# Patient Record
Sex: Female | Born: 1985 | Race: White | Hispanic: No | Marital: Married | Smoking: Former smoker
Health system: Southern US, Community
[De-identification: ages and names within clinical notes are randomized; demographics above are authoritative.]

## PROBLEM LIST (undated history)

## (undated) DIAGNOSIS — F32A Depression, unspecified: Secondary | ICD-10-CM

## (undated) DIAGNOSIS — L989 Disorder of the skin and subcutaneous tissue, unspecified: Secondary | ICD-10-CM

## (undated) DIAGNOSIS — F329 Major depressive disorder, single episode, unspecified: Secondary | ICD-10-CM

## (undated) DIAGNOSIS — N189 Chronic kidney disease, unspecified: Secondary | ICD-10-CM

## (undated) DIAGNOSIS — F909 Attention-deficit hyperactivity disorder, unspecified type: Secondary | ICD-10-CM

## (undated) DIAGNOSIS — F988 Other specified behavioral and emotional disorders with onset usually occurring in childhood and adolescence: Secondary | ICD-10-CM

## (undated) DIAGNOSIS — R519 Headache, unspecified: Secondary | ICD-10-CM

## (undated) DIAGNOSIS — R51 Headache: Secondary | ICD-10-CM

## (undated) DIAGNOSIS — T7840XA Allergy, unspecified, initial encounter: Secondary | ICD-10-CM

## (undated) HISTORY — PX: WISDOM TOOTH EXTRACTION: SHX21

## (undated) HISTORY — DX: Allergy, unspecified, initial encounter: T78.40XA

## (undated) HISTORY — DX: Headache, unspecified: R51.9

## (undated) HISTORY — PX: OTHER SURGICAL HISTORY: SHX169

## (undated) HISTORY — DX: Chronic kidney disease, unspecified: N18.9

## (undated) HISTORY — DX: Major depressive disorder, single episode, unspecified: F32.9

## (undated) HISTORY — PX: NO PAST SURGERIES: SHX2092

## (undated) HISTORY — DX: Disorder of the skin and subcutaneous tissue, unspecified: L98.9

## (undated) HISTORY — DX: Depression, unspecified: F32.A

## (undated) HISTORY — DX: Headache: R51

---
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
## 2009-06-23 DIAGNOSIS — D239 Other benign neoplasm of skin, unspecified: Secondary | ICD-10-CM

## 2009-06-23 HISTORY — DX: Other benign neoplasm of skin, unspecified: D23.9

## 2011-05-31 DIAGNOSIS — F909 Attention-deficit hyperactivity disorder, unspecified type: Secondary | ICD-10-CM

## 2011-05-31 HISTORY — DX: Attention-deficit hyperactivity disorder, unspecified type: F90.9

## 2012-01-10 LAB — PULMONARY FUNCTION TEST

## 2012-11-04 LAB — HM PAP SMEAR: HM Pap smear: NORMAL

## 2013-01-11 ENCOUNTER — Ambulatory Visit: Payer: Self-pay | Admitting: Internal Medicine

## 2013-02-04 ENCOUNTER — Encounter: Payer: Self-pay | Admitting: Internal Medicine

## 2013-02-04 ENCOUNTER — Ambulatory Visit (INDEPENDENT_AMBULATORY_CARE_PROVIDER_SITE_OTHER): Payer: Managed Care, Other (non HMO) | Admitting: Internal Medicine

## 2013-02-04 VITALS — BP 112/60 | HR 90 | Temp 98.0°F | Resp 14 | Ht 65.75 in | Wt 113.2 lb

## 2013-02-04 DIAGNOSIS — K5229 Other allergic and dietetic gastroenteritis and colitis: Secondary | ICD-10-CM | POA: Insufficient documentation

## 2013-02-04 DIAGNOSIS — M25579 Pain in unspecified ankle and joints of unspecified foot: Secondary | ICD-10-CM | POA: Insufficient documentation

## 2013-02-04 DIAGNOSIS — K9041 Non-celiac gluten sensitivity: Secondary | ICD-10-CM

## 2013-02-04 DIAGNOSIS — K900 Celiac disease: Secondary | ICD-10-CM

## 2013-02-04 DIAGNOSIS — M25571 Pain in right ankle and joints of right foot: Secondary | ICD-10-CM

## 2013-02-04 DIAGNOSIS — M8580 Other specified disorders of bone density and structure, unspecified site: Secondary | ICD-10-CM | POA: Insufficient documentation

## 2013-02-04 DIAGNOSIS — F988 Other specified behavioral and emotional disorders with onset usually occurring in childhood and adolescence: Secondary | ICD-10-CM | POA: Insufficient documentation

## 2013-02-04 DIAGNOSIS — F411 Generalized anxiety disorder: Secondary | ICD-10-CM

## 2013-02-04 DIAGNOSIS — M899 Disorder of bone, unspecified: Secondary | ICD-10-CM

## 2013-02-04 NOTE — Assessment & Plan Note (Signed)
accompanied by skin rash.  celiac panel pending. Gluten free diet discussed but not tried.
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
## 2013-02-04 NOTE — Assessment & Plan Note (Signed)
Calcium and vitamin and wt bearing exercise,  Repeat DE XA in 2015.

## 2013-02-04 NOTE — Assessment & Plan Note (Signed)
Multiple issues including bunion , 2nd toe contracture, referring to podiatry
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
## 2013-02-04 NOTE — Patient Instructions (Addendum)
Make sure you are taking  a MVI with 1 mg folic acid daily  Generic allegra is fexofenadine 60 or 180 mg  Look at [facility] generic zyrtec  Is cetirizine Generic claritin is loratadine  You should be taking 1000 units of Vit D and 1200 mg calcium  Through diet

## 2013-02-06 ENCOUNTER — Encounter: Payer: Self-pay | Admitting: Internal Medicine

## 2013-02-07 ENCOUNTER — Telehealth: Payer: Self-pay | Admitting: Internal Medicine

## 2013-02-07 NOTE — Telephone Encounter (Signed)
Letter mailed, but called patient and notified.

## 2013-02-07 NOTE — Telephone Encounter (Signed)
Calling for results  Work:  [phone] Or cell

## 2013-02-15 ENCOUNTER — Encounter: Payer: Self-pay | Admitting: Emergency Medicine

## 2013-02-15 ENCOUNTER — Ambulatory Visit (INDEPENDENT_AMBULATORY_CARE_PROVIDER_SITE_OTHER): Payer: Managed Care, Other (non HMO) | Admitting: Adult Health

## 2013-02-15 ENCOUNTER — Encounter: Payer: Self-pay | Admitting: Adult Health

## 2013-02-15 VITALS — BP 100/58 | HR 100 | Temp 98.6°F | Resp 12 | Ht 65.75 in | Wt 112.5 lb

## 2013-02-15 DIAGNOSIS — L255 Unspecified contact dermatitis due to plants, except food: Secondary | ICD-10-CM

## 2013-02-15 DIAGNOSIS — L237 Allergic contact dermatitis due to plants, except food: Secondary | ICD-10-CM | POA: Insufficient documentation

## 2013-02-15 MED ORDER — TRIAMCINOLONE ACETONIDE 0.1 % EX CREA: TOPICAL_CREAM | Freq: Two times a day (BID) | CUTANEOUS | Status: DC

## 2013-02-15 MED ORDER — PREDNISONE 10 MG PO TABS: ORAL_TABLET | ORAL | Status: DC

## 2013-02-15 NOTE — Patient Instructions (Addendum)
  Start your prednisone taper today. You will start with 6 tablets and then decreased by 1 tablet daily until done.  Use triamcinolone cream to the affected areas twice daily. Do not apply around the eyes.  Use benadryl for itching.  You can also try over the counter lotions such as Aveno products, calamine lotion, or benadryl anti-itch cream.  Please call if there is no improvement within 4-5 days.

## 2013-02-15 NOTE — Progress Notes (Signed)
  Subjective:    Patient ID: Tonya Peterson, female    DOB: [birth year], 27 y.o.   MRN: [id number]  HPI  Patient is a pleasant 27 year old female who presents to clinic after exposure to poison ivy while gardening. She is complaining of itching after developing a rash Tuesday night. The rashes on bilateral arms, neck and right shoulder. She has been applying an over-the-counter cream specifically for poison ivy but the itching has not been relieved.   Current Outpatient Prescriptions on File Prior to Visit  Medication Sig Dispense Refill  . amphetamine-dextroamphetamine (ADDERALL) 20 MG tablet Take 20 mg by mouth 2 (two) times daily.      . busPIRone (BUSPAR) 10 MG tablet Take 10 mg by mouth 3 (three) times daily as needed.      . norethindrone-ethinyl estradiol (MICROGESTIN,JUNEL,LOESTRIN) 1-20 MG-MCG tablet Take 1 tablet by mouth daily.       No current facility-administered medications on file prior to visit.     Review of Systems  Constitutional: Negative.   Respiratory: Negative.   Cardiovascular: Negative.   Skin: Positive for rash.       itching  Neurological: Negative.   Psychiatric/Behavioral: Negative.     BP 100/58  Pulse 100  Temp(Src) 98.6 F (37 C) (Oral)  Resp 12  Ht 5' 5.75" (1.67 m)  Wt 112 lb 8 oz (51.03 kg)  BMI 18.3 kg/m2  SpO2 98%     Objective:   Physical Exam  Cardiovascular: Normal rate and regular rhythm.   Pulmonary/Chest: Effort normal. No respiratory distress.  Skin:  Rash on bilateral arm, right shoulder, below her chin and on her neck. No s/s infection noted.  Psychiatric: She has a normal mood and affect. Her behavior is normal. Judgment and thought content normal.          Assessment & Plan:

## 2013-02-15 NOTE — Assessment & Plan Note (Signed)
Start prednisone taper beginning with 60 mg and decreasing by 10 mg daily until complete. Benadryl for itching. Triamcinolone cream to affected area twice a day as needed. Try Aveeno products which are very gentle and nonirritating.

## 2013-03-06 ENCOUNTER — Ambulatory Visit (INDEPENDENT_AMBULATORY_CARE_PROVIDER_SITE_OTHER): Payer: Managed Care, Other (non HMO) | Admitting: Internal Medicine

## 2013-03-06 ENCOUNTER — Encounter: Payer: Self-pay | Admitting: Internal Medicine

## 2013-03-06 VITALS — BP 118/78 | HR 88 | Temp 98.9°F | Resp 14 | Ht 65.75 in | Wt 113.2 lb

## 2013-03-06 DIAGNOSIS — R109 Unspecified abdominal pain: Secondary | ICD-10-CM

## 2013-03-06 DIAGNOSIS — M25579 Pain in unspecified ankle and joints of unspecified foot: Secondary | ICD-10-CM

## 2013-03-06 DIAGNOSIS — Z23 Encounter for immunization: Secondary | ICD-10-CM

## 2013-03-06 DIAGNOSIS — F988 Other specified behavioral and emotional disorders with onset usually occurring in childhood and adolescence: Secondary | ICD-10-CM

## 2013-03-06 DIAGNOSIS — K5229 Other allergic and dietetic gastroenteritis and colitis: Secondary | ICD-10-CM

## 2013-03-06 NOTE — Progress Notes (Signed)
Patient ID: Tonya Peterson, female   DOB: May 08, 1986, 27 y.o.   MRN: [id number]   Patient Active Problem List   Diagnosis Date Noted  . ADD (attention deficit disorder) 02/04/2013  . GAD (generalized anxiety disorder) 02/04/2013  . Osteopenia 02/04/2013  . 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 Pain in joint, ankle and foot 02/04/2013    Subjective:  CC:   Chief Complaint  Patient presents with  . Follow-up    1 month follow up    HPI:   Tonya Peterson a 27 y.o. female who presents One month follow up on multiple issues raised at initial visit one month ago.    1) Foot pain,  She was referred to podiatry for evaluation of foot pain secondary to bunion and contractures but did not keep the appt.  She s now interested in scheduling.  2) suspected food and environmental allergies.  She continues to develop recurrent redness of skin and abdominal pain within an hour or two of ingesting certain foods, e.g., avocadoes,  [employer]  bacon egg and cheese biscuits, and steak all cause severe abdominal pain. No prior GI evaluation .  Prior environmental and food allergy testing was incomplete.     Past Medical History  Diagnosis Date  . Depression   . Allergy   . Chronic kidney disease     stones  . Frequent headaches   . Skin abnormalities     precancerous moles    Past Surgical History  Procedure Laterality Date  . Ingrown toe nail Right     great toe       The following portions of the patient's history were reviewed and updated as appropriate: Allergies, current medications, and problem list.    Review of Systems:   12 Pt  review of systems was negative except those addressed in the HPI,     History   Social History  . Marital Status: Unknown    Spouse Name: N/A    Number of Children: N/A  . Years of Education: N/A   Occupational History  . Not on file.   Social History Main Topics  . Smoking status: Current Every Day Smoker -- 0.50  packs/day for 5 years    Types: Cigarettes    Start date: 02/05/2008  . Smokeless tobacco: Never Used  . Alcohol Use: Yes  . Drug Use: No  . Sexual Activity: Yes   Other Topics Concern  . Not on file   Social History Narrative  . No narrative on file    Objective:  Filed Vitals:   03/06/13 1615  BP: 118/78  Pulse: 88  Temp: 98.9 F (37.2 C)  Resp: 14     General appearance: alert, cooperative and appears stated age Ears: normal TM's and external ear canals both ears Throat: lips, mucosa, and tongue normal; teeth and gums normal Neck: no adenopathy, no carotid bruit, supple, symmetrical, trachea midline and thyroid not enlarged, symmetric, no tenderness/mass/nodules Back: symmetric, no curvature. ROM normal. No CVA tenderness. Lungs: clear to auscultation bilaterally Heart: regular rate and rhythm, S1, S2 normal, no murmur, click, rub or gallop Abdomen: soft, non-tender; bowel sounds normal; no masses,  no organomegaly Pulses: 2+ and symmetric Skin: Skin color, texture, turgor normal. No rashes or lesions Lymph nodes: Cervical, supraclavicular, and axillary nodes normal.  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 Consider  IBS bs gallbladder vs food allergy.   U/s and refer to allergy specialist for testing .  Celiac panel was negative    Updated Medication List Outpatient Encounter Prescriptions as of 03/06/2013  Medication Sig Dispense Refill  . amphetamine-dextroamphetamine (ADDERALL) 20 MG tablet Take 20 mg by mouth 2 (two) times daily.      . busPIRone (BUSPAR) 10 MG tablet Take 10 mg by mouth 3 (three) times daily as needed.      . norethindrone-ethinyl estradiol (MICROGESTIN,JUNEL,LOESTRIN) 1-20  MG-MCG tablet Take 1 tablet by mouth daily.      Marland Kitchen triamcinolone cream (KENALOG) 0.1 % Apply topically 2 (two) times daily.  30 g  0  . [DISCONTINUED] predniSONE (DELTASONE) 10 MG tablet Start with 60 mg (6 tablets) and reduce by 10 mg (1 tablet) daily until done.  21 tablet  0   No facility-administered encounter medications on file as of 03/06/2013.     Orders Placed This Encounter  Procedures  . US Abdomen Complete  . Flu Vaccine QUAD 36+ mos PF IM (Fluarix)  . Ambulatory referral to Allergy    No Follow-up on file.

## 2013-03-08 ENCOUNTER — Ambulatory Visit (HOSPITAL_COMMUNITY)
Admission: RE | Admit: 2013-03-08 | Discharge: 2013-03-08 | Disposition: A | Discharge status: Home / Self Care | Payer: Managed Care, Other (non HMO) | Source: Ambulatory Visit | Attending: Internal Medicine | Admitting: Internal Medicine

## 2013-03-08 DIAGNOSIS — R109 Unspecified abdominal pain: Secondary | ICD-10-CM | POA: Insufficient documentation

## 2013-03-08 DIAGNOSIS — N281 Cyst of kidney, acquired: Secondary | ICD-10-CM | POA: Insufficient documentation

## 2013-03-09 ENCOUNTER — Encounter: Payer: Self-pay | Admitting: Internal Medicine

## 2013-03-09 NOTE — Assessment & Plan Note (Addendum)
Accompanied by by cramping abdominal pain and skin erythema. Consider  IBS bs gallbladder vs food allergy.   U/s and refer to allergy specialist for testing .  Celiac panel was negative

## 2013-03-09 NOTE — Assessment & Plan Note (Signed)
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

## 2013-03-09 NOTE — Assessment & Plan Note (Signed)
Refer to podiatry for bunion and contracture

## 2013-04-01 ENCOUNTER — Encounter: Payer: Self-pay | Admitting: Internal Medicine

## 2013-04-24 ENCOUNTER — Encounter: Payer: Self-pay | Admitting: Internal Medicine

## 2013-04-24 ENCOUNTER — Ambulatory Visit (INDEPENDENT_AMBULATORY_CARE_PROVIDER_SITE_OTHER): Payer: Managed Care, Other (non HMO) | Admitting: Internal Medicine

## 2013-04-24 ENCOUNTER — Ambulatory Visit: Payer: Managed Care, Other (non HMO)

## 2013-04-24 DIAGNOSIS — J309 Allergic rhinitis, unspecified: Secondary | ICD-10-CM

## 2013-04-24 DIAGNOSIS — J302 Other seasonal allergic rhinitis: Secondary | ICD-10-CM

## 2013-04-24 DIAGNOSIS — T788XXS Other adverse effects, not elsewhere classified, sequela: Secondary | ICD-10-CM

## 2013-04-24 DIAGNOSIS — K5229 Other allergic and dietetic gastroenteritis and colitis: Secondary | ICD-10-CM

## 2013-04-24 NOTE — Progress Notes (Signed)
04/24/13- 27 yoF smoker referred by Dr. Darrick Huntsman for allergy evaluation. Complains of skin rash and short of breath , light-headed and congested this summer, associated with yard work. Stomach pains and diarrhea associated with avocado, orange juice, fatty foods- after eating at [employer] and [employer]. Says head hurt, stomach pains, lethargic. Childhood rash blamed on ranch dressing. Seasonal rhinitis with spring and fall pollens.  No problem with latex, aspirin. Celiac work-up and US abdomen negative.Skin testing for foods and environmental allergens at [facility] 04/10/12- pos for multiple grass, weed and tree pollens, banana, cantaloupe,egg, fish, cow milk, soy bean wheat, apple, celery. No ENT surgery Environment- rental house since March, 2014, oil heat, cat dog, no basement, no mold. Smokes 1/2 ppd. Married- denies pregnant. Works as Diplomatic Services operational officer at [employer].. Father and brother allergic rhinitis  Prior to Admission medications   Medication Sig Start Date End Date Taking? Authorizing Provider  amphetamine-dextroamphetamine (ADDERALL) 20 MG tablet Take 20 mg by mouth 2 (two) times daily.    Historical Provider, MD  busPIRone (BUSPAR) 10 MG tablet Take 10 mg by mouth 3 (three) times daily as needed.    Historical Provider, MD  norethindrone-ethinyl estradiol (MICROGESTIN,JUNEL,LOESTRIN) 1-20 MG-MCG tablet Take 1 tablet by mouth daily.    Historical Provider, MD  triamcinolone cream (KENALOG) 0.1 % Apply topically 2 (two) times daily. 02/15/13   Orville Govern, NP   Past Medical History  Diagnosis Date  . Depression   . Allergy   . Chronic kidney disease     stones  . Frequent headaches   . Skin abnormalities     precancerous moles   Past Surgical History  Procedure Laterality Date  . Ingrown toe nail Right     great toe  . Gum graft     Family History  Problem Relation Age of Onset  . Diabetes Mother   . Hypertension Father   . Cancer Maternal Grandfather   . Diabetes Paternal Grandmother    . Cancer Paternal Grandfather    History   Social History  . Marital Status: Married    Spouse Name: N/A    Number of Children: 0  . Years of Education: N/A   Occupational History  . 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 Smoking status: Current Every Day Smoker -- 0.50 packs/day for 5 years    Types: Cigarettes    Start date: 02/05/2008  . Smokeless tobacco: Never Used  . Alcohol Use: Yes     Comment: rare/social  . Drug Use: No  . Sexual Activity: Yes   Other Topics Concern  . Not on file   Social History Narrative  . No narrative on file   ROS-see HPI Constitutional:   No-   weight loss, night sweats, fevers, chills, fatigue, lassitude. HEENT:   No-  headaches, difficulty swallowing, tooth/dental problems, sore throat,       No-  sneezing, itching, ear ache, +nasal congestion, +post nasal drip,  CV:  No-   chest pain, orthopnea, PND, swelling in lower extremities, anasarca,  dizziness, palpitations Resp: No-   shortness of breath with exertion or at rest.              No-   productive cough,  No non-productive cough,  No- coughing up of blood.              No-   change in color of mucus.  No- wheezing.   Skin: + rash or lesions. GI:  No-   heartburn, indigestion, +abdominal pain, nausea, vomiting, diarrhea,                 change in bowel habits, loss of appetite GU: No-   dysuria, change in color of urine, no urgency or frequency.  No- flank pain. MS:  No-   joint pain or swelling.  No- decreased range of motion.  No- back pain. Neuro-     nothing unusual Psych:  No- change in mood or affect. No depression or anxiety.  No memory loss.  OBJ- Physical Exam General- Alert, Oriented, Affect-appropriate, Distress- none acute. Slender Skin- rash-none, lesions- none, excoriation- none Lymphadenopathy- none Head- atraumatic            Eyes- Gross vision intact, PERRLA, conjunctivae and secretions clear            Ears- Hearing, canals-normal             Nose- Clear, no-Septal dev, mucus, polyps, erosion, perforation             Throat- Mallampati II , mucosa clear , drainage- none, tonsils-present Neck- flexible , trachea midline, no stridor , thyroid nl, carotid no bruit Chest - symmetrical excursion , unlabored           Heart/CV- RRR , no murmur , no gallop  , no rub, nl s1 s2                           - JVD- none , edema- none, stasis changes- none, varices- none           Lung- clear to P&A, wheeze- none, cough- none , dullness-none, rub- none           Chest wall-  Abd- tender-no, distended-no, bowel sounds-present, HSM- no Br/ Gen/ Rectal- Not done, not indicated Extrem- cyanosis- none, clubbing, none, atrophy- none, strength- nl Neuro- grossly intact to observation

## 2013-04-24 NOTE — Patient Instructions (Addendum)
Order- lab- Allergy profile, Food IgE profile, Food IgG profile, IgE to avocado, IgE to alpha-gal/ alpha 1-3 galactose    Dx food allergy , allergic rhinitis

## 2013-04-25 LAB — ALLERGY FULL PROFILE
Allergen, D pternoyssinus,d7: 0.48 kU/L — ABNORMAL HIGH
Bahia Grass: 0.27 kU/L — ABNORMAL HIGH
Bermuda Grass: <0.1 kU/L
Candida Albicans: <0.1 kU/L
Curvularia lunata: <0.1 kU/L
D. farinae: 0.45 kU/L — ABNORMAL HIGH
Dog Dander: <0.1 kU/L
Elm IgE: <0.1 kU/L
Fescue: 0.43 kU/L — ABNORMAL HIGH
G009 Red Top: 0.47 kU/L — ABNORMAL HIGH
Goldenrod: <0.1 kU/L
Oak: <0.1 kU/L
Sycamore Tree: 0.11 kU/L — ABNORMAL HIGH
Timothy Grass: 0.34 kU/L — ABNORMAL HIGH

## 2013-04-25 LAB — ALLERGEN FOOD PROFILE SPECIFIC IGE
Corn: <0.1 kU/L
Fish Cod: <0.1 kU/L
IgE (Immunoglobulin E), Serum: 43.5 IU/mL (ref 0.0–180.0)
Milk IgE: <0.1 kU/L
Peanut IgE: <0.1 kU/L
Soybean IgE: <0.1 kU/L
Tuna IgE: <0.1 kU/L
Wheat IgE: <0.1 kU/L

## 2013-04-25 LAB — ALLERGEN AVOCADO F96: Allergen Avocado f96: <0.1 kU/L

## 2013-05-01 LAB — IGG FOOD PANEL
Allergen, Milk, IgG: 16.4 ug/mL — ABNORMAL HIGH (ref <0.15)
Beef, IgG: 6.7 ug/mL — ABNORMAL HIGH (ref <2.0)
Corn, IgG: <0.15 ug/mL (ref <0.15)
Egg yolk, IgG: 2.4 ug/mL — ABNORMAL HIGH (ref <2.0)

## 2013-05-08 NOTE — Progress Notes (Signed)
Quick Note:  Released to MyChart account and left message for patient to call the office if any concerns. ______

## 2013-05-14 ENCOUNTER — Encounter: Payer: Self-pay | Admitting: Internal Medicine

## 2013-05-14 DIAGNOSIS — J302 Other seasonal allergic rhinitis: Secondary | ICD-10-CM | POA: Insufficient documentation

## 2013-05-14 NOTE — Assessment & Plan Note (Signed)
This may be incidental to her other multiple somatic complaints. Plan- Allergy profiles with attention to IgE. Antihistamines and nasal sprays if helpful.

## 2013-05-14 NOTE — Assessment & Plan Note (Addendum)
Very indistinct relation between symptoms and specific foods Plan- Allergy profiles, food diary

## 2013-06-05 ENCOUNTER — Ambulatory Visit: Payer: Managed Care, Other (non HMO) | Admitting: Internal Medicine

## 2013-09-24 LAB — OB RESULTS CONSOLE GBS
GBS: NEGATIVE
STREP GROUP B AG: NEGATIVE

## 2013-09-24 LAB — OB RESULTS CONSOLE GC/CHLAMYDIA
Chlamydia: NEGATIVE
Gonorrhea: NEGATIVE
Gonorrhea: NEGATIVE

## 2014-03-10 LAB — OB RESULTS CONSOLE RPR: RPR: NONREACTIVE

## 2014-03-10 LAB — OB RESULTS CONSOLE ABO/RH: RH Type: POSITIVE

## 2014-03-10 LAB — OB RESULTS CONSOLE HIV ANTIBODY (ROUTINE TESTING): HIV: NONREACTIVE

## 2014-03-10 LAB — OB RESULTS CONSOLE RUBELLA ANTIBODY, IGM: Rubella: IMMUNE

## 2014-03-10 LAB — OB RESULTS CONSOLE ANTIBODY SCREEN: Antibody Screen: NEGATIVE

## 2014-03-10 LAB — OB RESULTS CONSOLE VARICELLA ZOSTER ANTIBODY, IGG: Varicella: NON-IMMUNE/NOT IMMUNE

## 2014-03-10 LAB — OB RESULTS CONSOLE HEPATITIS B SURFACE ANTIGEN: HEP B S AG: NEGATIVE

## 2014-05-30 NOTE — L&D Delivery Note (Signed)
Delivery Note At 6:30 PM a viable female was delivered via Vaginal, Spontaneous Delivery (Presentation: ;  ).  APGAR: 9, 10;   Placenta status: Intact, Spontaneous.  Cord: 3 vessels with the following complications: None.  Weight:        6# 14.5 Oz.        Grams:  [id number] Infant's name: Anette Riedeloah   Anesthesia: Epidural  Episiotomy: None Lacerations: 2nd degree Suture Repair: 2.0   Est. Blood Loss (mL): 375  Mom to postpartum.  Baby to Couplet care / Skin to Skin.  SVD of a live viable female infant in OA position after a long second stage. After SROM, light meconium was seen- peds was available during delivery. After delivery of the infant, he was noted to be vigorous and was placed on the maternal abdomen where the cord was clamped and cut after pulsation ceased by the fob. Apgars were 9/10. The placenta was then delivered via Duncan mechanism and was intact with a 3-vessel cord. Pitocin was started and the uterus was firm with fundal massage and had minimal bleeding. The perineum was inspected and was found to have a second degree tear and was repaired with a 2-0 vicryl under epidural anesthesia and was hemostatic after repair. EBL was 375. Mother and baby are bonding well. Weight is pending after mother finishes breastfeeding.   Jannet MantisSubudhi,  Shilah Hefel 10/06/2014, 7:33 PM

## 2014-09-25 LAB — OB RESULTS CONSOLE GC/CHLAMYDIA
Chlamydia: NEGATIVE
Gonorrhea: NEGATIVE

## 2014-09-25 LAB — OB RESULTS CONSOLE GBS: GBS: NEGATIVE

## 2014-10-06 ENCOUNTER — Inpatient Hospital Stay
Admission: EM | Admit: 2014-10-06 | Discharge: 2014-10-08 | DRG: 775 | Disposition: A | Discharge status: Home / Self Care | Payer: Medicaid Other | Attending: Obstetrics and Gynecology | Admitting: Obstetrics and Gynecology

## 2014-10-06 ENCOUNTER — Encounter: Payer: Self-pay | Admitting: *Deleted

## 2014-10-06 ENCOUNTER — Inpatient Hospital Stay: Payer: Medicaid Other | Admitting: Registered Nurse

## 2014-10-06 DIAGNOSIS — O9902 Anemia complicating childbirth: Secondary | ICD-10-CM | POA: Diagnosis not present

## 2014-10-06 DIAGNOSIS — O480 Post-term pregnancy: Secondary | ICD-10-CM | POA: Diagnosis present

## 2014-10-06 DIAGNOSIS — O99344 Other mental disorders complicating childbirth: Secondary | ICD-10-CM | POA: Diagnosis present

## 2014-10-06 DIAGNOSIS — Z3A40 40 weeks gestation of pregnancy: Secondary | ICD-10-CM | POA: Diagnosis present

## 2014-10-06 DIAGNOSIS — D500 Iron deficiency anemia secondary to blood loss (chronic): Secondary | ICD-10-CM | POA: Diagnosis present

## 2014-10-06 DIAGNOSIS — Z87891 Personal history of nicotine dependence: Secondary | ICD-10-CM | POA: Diagnosis not present

## 2014-10-06 DIAGNOSIS — F411 Generalized anxiety disorder: Secondary | ICD-10-CM | POA: Diagnosis present

## 2014-10-06 HISTORY — DX: Attention-deficit hyperactivity disorder, unspecified type: F90.9

## 2014-10-06 HISTORY — DX: Other specified behavioral and emotional disorders with onset usually occurring in childhood and adolescence: F98.8

## 2014-10-06 LAB — CBC
HCT: 37 % (ref 35.0–47.0)
Hemoglobin: 12.2 g/dL (ref 12.0–16.0)
MCH: 31.2 pg (ref 26.0–34.0)
MCHC: 33 g/dL (ref 32.0–36.0)
MCV: 94.8 fL (ref 80.0–100.0)
Platelets: 128 10*3/uL — ABNORMAL LOW (ref 150–440)
RBC: 3.9 MIL/uL (ref 3.80–5.20)
RDW: 12.7 % (ref 11.5–14.5)
WBC: 19.2 10*3/uL — AB (ref 3.6–11.0)

## 2014-10-06 LAB — TYPE AND SCREEN
ABO/RH(D): O POS
Antibody Screen: NEGATIVE

## 2014-10-06 LAB — ABO/RH: ABO/RH(D): O POS

## 2014-10-06 MED ORDER — BUPIVACAINE HCL (PF) 0.25 % IJ SOLN
INTRAMUSCULAR | Status: DC | PRN
  Administered 2014-10-06 (×2): 4 mL

## 2014-10-06 MED ORDER — PHENYLEPHRINE 40 MCG/ML (10ML) SYRINGE FOR IV PUSH (FOR BLOOD PRESSURE SUPPORT)
80.0000 ug | PREFILLED_SYRINGE | INTRAVENOUS | Status: DC | PRN
  Filled 2014-10-06: qty 2

## 2014-10-06 MED ORDER — OXYTOCIN BOLUS FROM INFUSION: 500.0000 mL | INTRAVENOUS | Status: DC

## 2014-10-06 MED ORDER — IBUPROFEN 600 MG PO TABS
ORAL_TABLET | ORAL | Status: AC
  Administered 2014-10-07: 600 mg via ORAL
  Filled 2014-10-06: qty 1

## 2014-10-06 MED ORDER — DIPHENHYDRAMINE HCL 25 MG PO CAPS: 25.0000 mg | ORAL_CAPSULE | Freq: Four times a day (QID) | ORAL | Status: DC | PRN

## 2014-10-06 MED ORDER — ACETAMINOPHEN 325 MG PO TABS: 650.0000 mg | ORAL_TABLET | ORAL | Status: DC | PRN

## 2014-10-06 MED ORDER — EPHEDRINE 5 MG/ML INJ
10.0000 mg | INTRAVENOUS | Status: DC | PRN
  Filled 2014-10-06: qty 2

## 2014-10-06 MED ORDER — LIDOCAINE-EPINEPHRINE (PF) 1.5 %-1:200000 IJ SOLN
INTRAMUSCULAR | Status: DC | PRN
  Administered 2014-10-06: 3 mL

## 2014-10-06 MED ORDER — BUTORPHANOL TARTRATE 1 MG/ML IJ SOLN
INTRAMUSCULAR | Status: AC
  Administered 2014-10-06: 1 mg via INTRAVENOUS
  Filled 2014-10-06: qty 1

## 2014-10-06 MED ORDER — OXYCODONE-ACETAMINOPHEN 5-325 MG PO TABS
1.0000 | ORAL_TABLET | ORAL | Status: DC | PRN
  Administered 2014-10-07: 1 via ORAL
  Filled 2014-10-06 (×2): qty 1

## 2014-10-06 MED ORDER — BENZOCAINE-MENTHOL 20-0.5 % EX AERO
1.0000 "application " | INHALATION_SPRAY | CUTANEOUS | Status: DC | PRN
  Filled 2014-10-06: qty 56

## 2014-10-06 MED ORDER — OXYTOCIN 40 UNITS IN LACTATED RINGERS INFUSION - SIMPLE MED: 62.5000 mL/h | Freq: Once | INTRAVENOUS | Status: DC

## 2014-10-06 MED ORDER — AMMONIA AROMATIC IN INHA
RESPIRATORY_TRACT | Status: AC
  Filled 2014-10-06: qty 10

## 2014-10-06 MED ORDER — MISOPROSTOL 200 MCG PO TABS
ORAL_TABLET | ORAL | Status: AC
  Filled 2014-10-06: qty 4

## 2014-10-06 MED ORDER — BUTORPHANOL TARTRATE 1 MG/ML IJ SOLN
1.0000 mg | INTRAMUSCULAR | Status: DC | PRN
  Administered 2014-10-06: 1 mg via INTRAVENOUS

## 2014-10-06 MED ORDER — LIDOCAINE HCL (PF) 1 % IJ SOLN
30.0000 mL | INTRAMUSCULAR | Status: DC | PRN
  Filled 2014-10-06: qty 30

## 2014-10-06 MED ORDER — ONDANSETRON HCL 4 MG/2ML IJ SOLN: 4.0000 mg | Freq: Four times a day (QID) | INTRAMUSCULAR | Status: DC | PRN

## 2014-10-06 MED ORDER — FERROUS SULFATE 325 (65 FE) MG PO TABS
325.0000 mg | ORAL_TABLET | Freq: Two times a day (BID) | ORAL | Status: DC
  Administered 2014-10-07 – 2014-10-08 (×2): 325 mg via ORAL
  Filled 2014-10-06 (×2): qty 1

## 2014-10-06 MED ORDER — LIDOCAINE HCL (PF) 1 % IJ SOLN
INTRAMUSCULAR | Status: AC
  Filled 2014-10-06: qty 30

## 2014-10-06 MED ORDER — LACTATED RINGERS IV SOLN
500.0000 mL | INTRAVENOUS | Status: DC | PRN
  Administered 2014-10-06: 500 mL via INTRAVENOUS

## 2014-10-06 MED ORDER — OXYTOCIN 10 UNIT/ML IJ SOLN: 10.0000 [IU] | Freq: Once | INTRAMUSCULAR | Status: DC

## 2014-10-06 MED ORDER — FENTANYL 2.5 MCG/ML W/ROPIVACAINE 0.2% IN NS 100 ML EPIDURAL INFUSION (ARMC-ANES)
EPIDURAL | Status: AC
  Administered 2014-10-06 (×2): 9 mL/h via EPIDURAL
  Filled 2014-10-06: qty 100

## 2014-10-06 MED ORDER — OXYTOCIN 10 UNIT/ML IJ SOLN
INTRAMUSCULAR | Status: AC
  Filled 2014-10-06: qty 2

## 2014-10-06 MED ORDER — LACTATED RINGERS IV SOLN
INTRAVENOUS | Status: DC
  Administered 2014-10-06: 125 mL/h via INTRAVENOUS

## 2014-10-06 MED ORDER — ZOLPIDEM TARTRATE 5 MG PO TABS: 5.0000 mg | ORAL_TABLET | Freq: Every evening | ORAL | Status: DC | PRN

## 2014-10-06 MED ORDER — WITCH HAZEL-GLYCERIN EX PADS
1.0000 "application " | MEDICATED_PAD | CUTANEOUS | Status: DC | PRN
  Administered 2014-10-08: 1 via TOPICAL
  Filled 2014-10-06: qty 200
  Filled 2014-10-06: qty 100
  Filled 2014-10-06: qty 200

## 2014-10-06 MED ORDER — DIPHENHYDRAMINE HCL 50 MG/ML IJ SOLN: 12.5000 mg | INTRAMUSCULAR | Status: DC | PRN

## 2014-10-06 MED ORDER — ONDANSETRON HCL 4 MG PO TABS
4.0000 mg | ORAL_TABLET | ORAL | Status: DC | PRN
Start: 2014-10-06 — End: 2014-10-08

## 2014-10-06 MED ORDER — OXYTOCIN 40 UNITS IN LACTATED RINGERS INFUSION - SIMPLE MED: 62.5000 mL/h | INTRAVENOUS | Status: DC

## 2014-10-06 MED ORDER — OXYCODONE-ACETAMINOPHEN 5-325 MG PO TABS
2.0000 | ORAL_TABLET | ORAL | Status: DC | PRN
  Administered 2014-10-06 – 2014-10-08 (×7): 2 via ORAL
  Filled 2014-10-06 (×7): qty 2

## 2014-10-06 MED ORDER — OXYTOCIN 40 UNITS IN LACTATED RINGERS INFUSION - SIMPLE MED
1.0000 m[IU]/min | INTRAVENOUS | Status: DC
  Administered 2014-10-06: 1 m[IU]/min via INTRAVENOUS

## 2014-10-06 MED ORDER — SIMETHICONE 80 MG PO CHEW: 80.0000 mg | CHEWABLE_TABLET | ORAL | Status: DC | PRN

## 2014-10-06 MED ORDER — PRENATAL MULTIVITAMIN CH
1.0000 | ORAL_TABLET | Freq: Every day | ORAL | Status: DC
  Administered 2014-10-07 – 2014-10-08 (×2): 1 via ORAL
  Filled 2014-10-06 (×2): qty 1

## 2014-10-06 MED ORDER — LANOLIN HYDROUS EX OINT: TOPICAL_OINTMENT | CUTANEOUS | Status: DC | PRN

## 2014-10-06 MED ORDER — DIBUCAINE 1 % RE OINT
1.0000 "application " | TOPICAL_OINTMENT | RECTAL | Status: DC | PRN
  Administered 2014-10-08: 1 via RECTAL
  Filled 2014-10-06 (×2): qty 28

## 2014-10-06 MED ORDER — FENTANYL 2.5 MCG/ML W/ROPIVACAINE 0.2% IN NS 100 ML EPIDURAL INFUSION (ARMC-ANES)
9.0000 mL/h | EPIDURAL | Status: DC
  Administered 2014-10-06: 9 mL/h via EPIDURAL

## 2014-10-06 MED ORDER — TERBUTALINE SULFATE 1 MG/ML IJ SOLN
0.2500 mg | Freq: Once | INTRAMUSCULAR | Status: DC | PRN
  Filled 2014-10-06: qty 1

## 2014-10-06 MED ORDER — VARICELLA VIRUS VACCINE LIVE 1350 PFU/0.5ML IJ SUSR
0.5000 mL | INTRAMUSCULAR | Status: AC | PRN
  Administered 2014-10-08: 0.5 mL via SUBCUTANEOUS
  Filled 2014-10-06: qty 0.5

## 2014-10-06 MED ORDER — OXYTOCIN 40 UNITS IN LACTATED RINGERS INFUSION - SIMPLE MED
INTRAVENOUS | Status: AC
  Filled 2014-10-06: qty 1000

## 2014-10-06 MED ORDER — DOCUSATE SODIUM 100 MG PO CAPS
100.0000 mg | ORAL_CAPSULE | Freq: Two times a day (BID) | ORAL | Status: DC
  Administered 2014-10-07 – 2014-10-08 (×3): 100 mg via ORAL
  Filled 2014-10-06 (×3): qty 1

## 2014-10-06 MED ORDER — IBUPROFEN 600 MG PO TABS
600.0000 mg | ORAL_TABLET | Freq: Four times a day (QID) | ORAL | Status: DC
  Administered 2014-10-07 – 2014-10-08 (×6): 600 mg via ORAL
  Filled 2014-10-06 (×6): qty 1

## 2014-10-06 MED ORDER — MAGNESIUM HYDROXIDE 400 MG/5ML PO SUSP: 30.0000 mL | ORAL | Status: DC | PRN

## 2014-10-06 MED ORDER — ONDANSETRON HCL 4 MG/2ML IJ SOLN: 4.0000 mg | INTRAMUSCULAR | Status: DC | PRN

## 2014-10-06 NOTE — H&P (Signed)
Obstetric History and Physical  GrenadaBrittany Sula RumpleBullis Defenbaugh is a 29 y.o. G1P0 with IUP at 1221w1d presenting for contractions that started last night. Patient states she has been having  regular, every 2-4 minutes contractions, none vaginal bleeding, intact membranes, with active fetal movement.    Prenatal Course Source of Care: [facility]  with onset of care at 10.1 weeks. Pt is a centering pregnancy patient with a benign prenatal course.  Pregnancy complications or risks: Patient Active Problem List   Diagnosis Date Noted  . Labor and delivery indication for care or intervention 10/06/2014  . Seasonal and perennial allergic rhinitis 05/14/2013  . ADD (attention deficit disorder) 02/04/2013  . GAD (generalized anxiety disorder) 02/04/2013  . Osteopenia 02/04/2013  . 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 Pain in joint, ankle and foot 02/04/2013     Prenatal labs and studies: ABO, Rh: O/Positive/-- (10/12 0000) Antibody: Negative (10/12 0000) Rubella: Immune (10/12 0000) Varicella: Non-Immune RPR: Nonreactive (10/12 0000)  HBsAg: Negative (10/12 0000)  HIV: Non-reactive (10/12 0000)  ZOX:WRUEAVWUGBS:Negative (04/28 0000) 1 hr Glucola  130 Genetic screening normal Anatomy US normal Tdap: given at 31 weeks  Flu: NA   Past Medical History  Diagnosis Date  . Depression   . Allergy   . Frequent headaches   . Skin abnormalities     precancerous moles  . ADHD (attention deficit hyperactivity disorder) 05/2011  . Chronic kidney disease     left cyst, one kidney stone  . ADD (attention deficit disorder)     Past Surgical History  Procedure Laterality Date  . Ingrown toe nail Right     great toe  . Gum graft    . No past surgeries      OB History  Gravida Para Term Preterm AB SAB TAB Ectopic Multiple Living  1             # Outcome Date GA Lbr Len/2nd Weight Sex Delivery Anes PTL Lv  1 Current               History   Social History  . Marital Status: Married    Spouse  Name: N/A  . Number of Children: 0  . Years of Education: N/A   Occupational History  . 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 Smoking status: Former Smoker -- 0.50 packs/day for 5 years    Types: Cigarettes    Start date: 02/05/2008    Quit date: 09/06/2014  . Smokeless tobacco: Never Used  . Alcohol Use: No     Comment: rare/social  . Drug Use: No  . Sexual Activity: Yes    Birth Control/ Protection: None   Other Topics Concern  . None   Social History Narrative    Family History  Problem Relation Age of Onset  . Diabetes Mother   . Hypertension Father   . Cancer Maternal Grandfather   . Diabetes Paternal Grandmother   . Cancer Paternal Grandfather     Prescriptions prior to admission  Medication Sig Dispense Refill Last Dose  . amphetamine-dextroamphetamine (ADDERALL) 20 MG tablet Take 20 mg by mouth 2 (two) times daily.   10/05/2014 at Unknown time  . busPIRone (BUSPAR) 10 MG tablet Take 10 mg by mouth 3 (three) times daily as needed.   Taking  . norethindrone-ethinyl estradiol (MICROGESTIN,JUNEL,LOESTRIN) 1-20 MG-MCG tablet Take 1 tablet by mouth daily.   Taking  . triamcinolone cream (KENALOG) 0.1 % Apply topically 2 (  two) times daily. 30 g 0 Taking    No Known Allergies  Review of Systems: Negative except for what is mentioned in HPI.  Physical Exam: BP 129/79 mmHg  Pulse 76  Temp(Src) 98.1 F (36.7 C) (Oral)  Ht 5\' 5"  (1.651 m)  Wt 70.308 kg (155 lb)  BMI 25.79 kg/m2 GENERAL: Well-developed, well-nourished female in no acute distress.  LUNGS: Clear to auscultation bilaterally.  HEART: Regular rate and rhythm. ABDOMEN: Soft, nontender, nondistended, gravid. EXTREMITIES: Nontender, no edema, 2+ distal pulses. Dilation: 3 Effacement (%): 80 Cervical Position: Middle Station: 0 Presentation: Vertex Exam by:: Gaston IslamA. Daniels, RN    Pt with SROM at 9am for light meconium.  Presentation: cephalic FHT:  Baseline rate 145 bpm   Variability  moderate  Accelerations present   Decelerations none Contractions: Every 2-4 mins  Pertinent Labs/Studies:   No results found for this or any previous visit (from the past 24 hour(s)).  Assessment : Antionette FairyBrittany Bullis Toohey is a 29 y.o. G1P0 at 9249w1d being admitted for labor.  Plan: Labor: Expectant management.  Induction/Augmentation as needed, per protocol. Pt plans epidural.  FWB: Reassuring fetal heart tracing.  GBS negative Delivery plan: Hopeful for vaginal delivery  Jannet Mantisourtney Subudhi, CNM [facility]

## 2014-10-06 NOTE — OB Triage Note (Signed)
Complaining of contractions, starting at 0230 am. No leaking fluid or bleeding.

## 2014-10-06 NOTE — Anesthesia Procedure Notes (Signed)
Epidural Patient location during procedure: OB Start time: 10/06/2014 11:35 AM End time: 10/06/2014 11:40 AM  Staffing Resident/CRNA: Stormy FabianURTIS, Santos Hardwick Performed by: resident/CRNA   Preanesthetic Checklist Completed: patient identified, site marked, surgical consent, pre-op evaluation, timeout performed, IV checked, risks and benefits discussed and monitors and equipment checked  Epidural Patient position: sitting Prep: Betadine Patient monitoring: heart rate, continuous pulse ox and blood pressure Approach: midline Location: L4-L5 Injection technique: LOR saline  Needle:  Needle type: Tuohy  Needle gauge: 18 G Needle length: 9 cm and 9 Needle insertion depth: 5 cm Catheter type: closed end flexible Catheter size: 20 Guage Catheter at skin depth: 9 cm Test dose: negative and 1.5% lidocaine with Epi 1:200 K  Assessment Sensory level: T10 Events: blood not aspirated, injection not painful, no injection resistance, negative IV test and no paresthesia  Additional Notes Pt's history reviewed and consent obtained as per OB consent Patient tolerated the insertion well without complications. Negative SATD, negative IVTD All VSS were obtained and monitored through OBIX and nursing protocols followed.Reason for block:procedure for pain

## 2014-10-06 NOTE — Plan of Care (Signed)
Problem: Consults Goal: Birthing Suites Patient Information Press F2 to bring up selections list  Outcome: Progressing  Pt > [redacted] weeks EGA     

## 2014-10-06 NOTE — H&P (Signed)
Obstetric History and Physical  GrenadaBrittany Sula RumpleBullis Peterson is a 29 y.o. G1P0 with IUP at 6761w1d presenting for contractions that started last night . Patient states she has been having  regular, every 2-4 minutes contractions, none vaginal bleeding, intact membranes, with active fetal movement.    Prenatal Course Source of Care: [facility]  with onset of care at 10 weeks. Pt is a centering pregnancy pt with a  Benign prenatal course.  Pregnancy complications or risks: Patient Active Problem List   Diagnosis Date Noted  . Labor and delivery indication for care or intervention 10/06/2014  . Seasonal and perennial allergic rhinitis 05/14/2013  . ADD (attention deficit disorder) 02/04/2013  . GAD (generalized anxiety disorder) 02/04/2013  . Osteopenia 02/04/2013  . 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 Pain in joint, ankle and foot 02/04/2013   She plans to breastfeed   Prenatal labs and studies: ABO, Rh: O/Positive/-- (10/12 0000) Antibody: Negative (10/12 0000) Rubella: Immune (10/12 0000) Varicella: Non-immune RPR: Nonreactive (10/12 0000)  HBsAg: Negative (10/12 0000)  HIV: Non-reactive (10/12 0000)  ZOX:WRUEAVWUGBS:Negative (04/28 0000) Genetic screening normal Anatomy US normal Tdap: Given at 31 weeks Flu: NA   Past Medical History  Diagnosis Date  . Depression   . Allergy   . Frequent headaches   . Skin abnormalities     precancerous moles  . ADHD (attention deficit hyperactivity disorder) 05/2011  . Chronic kidney disease     left cyst, one kidney stone    Past Surgical History  Procedure Laterality Date  . Ingrown toe nail Right     great toe  . Gum graft    . No past surgeries      OB History  Gravida Para Term Preterm AB SAB TAB Ectopic Multiple Living  1             # Outcome Date GA Lbr Len/2nd Weight Sex Delivery Anes PTL Lv  1 Current               History   Social History  . Marital Status: Married    Spouse Name: N/A  . Number of Children: 0  .  Years of Education: N/A   Occupational History  . 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 Smoking status: Former Smoker -- 0.50 packs/day for 5 years    Types: Cigarettes    Start date: 02/05/2008    Quit date: 09/06/2014  . Smokeless tobacco: Never Used  . Alcohol Use: No     Comment: rare/social  . Drug Use: No  . Sexual Activity: Yes    Birth Control/ Protection: None   Other Topics Concern  . None   Social History Narrative    Family History  Problem Relation Age of Onset  . Diabetes Mother   . Hypertension Father   . Cancer Maternal Grandfather   . Diabetes Paternal Grandmother   . Cancer Paternal Grandfather     Prescriptions prior to admission  Medication Sig Dispense Refill Last Dose  . amphetamine-dextroamphetamine (ADDERALL) 20 MG tablet Take 20 mg by mouth 2 (two) times daily.   10/05/2014 at Unknown time  . busPIRone (BUSPAR) 10 MG tablet Take 10 mg by mouth 3 (three) times daily as needed.   Not Taking at Unknown time  . norethindrone-ethinyl estradiol (MICROGESTIN,JUNEL,LOESTRIN) 1-20 MG-MCG tablet Take 1 tablet by mouth daily.   Not Taking at Unknown time  . triamcinolone cream (KENALOG) 0.1 % Apply topically  2 (two) times daily. (Patient not taking: Reported on 10/06/2014) 30 g 0 Not Taking at Unknown time    No Known Allergies  Review of Systems: Negative except for what is mentioned in HPI.  Physical Exam: BP 113/68 mmHg  Pulse 77  Temp(Src) 98.1 F (36.7 C) (Oral)  Resp 18  Ht 5\' 5"  (1.651 m)  Wt 70.308 kg (155 lb)  BMI 25.79 kg/m2 GENERAL: Well-developed, well-nourished female in no acute distress.  LUNGS: Clear to auscultation bilaterally.  HEART: Regular rate and rhythm. ABDOMEN: Soft, nontender, nondistended, gravid. EXTREMITIES: Nontender, no edema, 2+ distal pulses. Dilation: 10 Dilation Complete Date: 10/06/14 Dilation Complete Time: 1428 Effacement (%): 100 Cervical Position: Middle Station: +2 Presentation:  Vertex Exam by:: Tonya IslamA. Daniels, RN   Admitted with a cervical exam of 3/90/0. SROM for meconium at 9am.  Presentation: cephalic FHT:  Baseline rate 130s-140s bpm   Variability moderate  Accelerations present   Decelerations none Contractions: Every 2-4 mins  Pertinent Labs/Studies:   Results for orders placed or performed during the hospital encounter of 10/06/14 (from the past 24 hour(s))  Type and screen     Status: None   Collection Time: 10/06/14  9:30 AM  Result Value Ref Range   ABO/RH(D) O POS    Antibody Screen NEG    Sample Expiration 10/09/2014   CBC     Status: Abnormal   Collection Time: 10/06/14  9:30 AM  Result Value Ref Range   WBC 19.2 (H) 3.6 - 11.0 K/uL   RBC 3.90 3.80 - 5.20 MIL/uL   Hemoglobin 12.2 12.0 - 16.0 g/dL   HCT 42.537.0 95.635.0 - 38.747.0 %   MCV 94.8 80.0 - 100.0 fL   MCH 31.2 26.0 - 34.0 pg   MCHC 33.0 32.0 - 36.0 g/dL   RDW 56.412.7 33.211.5 - 95.114.5 %   Platelets 128 (L) 150 - 440 K/uL  ABO/Rh     Status: None   Collection Time: 10/06/14  9:31 AM  Result Value Ref Range   ABO/RH(D) O POS     Assessment : Tonya Peterson is a 29 y.o. G1P0 at 4876w1d being admitted for labor.  Plan: Labor: Expectant management.  Induction/Augmentation as needed, per protocol FWB: Reassuring fetal heart tracing.  GBS negative Delivery plan: Hopeful for vaginal delivery  Jannet Mantisourtney Hensley Aziz, CNM [facility]

## 2014-10-06 NOTE — Anesthesia Preprocedure Evaluation (Signed)
Anesthesia Evaluation  Patient identified by MRN, date of birth, ID band Patient awake    Reviewed: Allergy & Precautions, H&P , NPO status , Patient's Chart, lab work & pertinent test results  History of Anesthesia Complications Negative for: history of anesthetic complications  Airway Mallampati: II  TM Distance: >3 FB     Dental no notable dental hx.    Pulmonary neg pulmonary ROS, former smoker,    Pulmonary exam normal       Cardiovascular negative cardio ROS Normal cardiovascular exam    Neuro/Psych  Headaches, PSYCHIATRIC DISORDERS    GI/Hepatic negative GI ROS, Neg liver ROS, Npo since 10/05/14   Endo/Other  negative endocrine ROS  Renal/GU negative Renal ROSCyst on Left Kidney  negative genitourinary   Musculoskeletal   Abdominal   Peds  Hematology negative hematology ROS (+)   Anesthesia Other Findings   Reproductive/Obstetrics (+) Pregnancy                             Anesthesia Physical Anesthesia Plan  ASA: II  Anesthesia Plan: Epidural   Post-op Pain Management:    Induction:   Airway Management Planned:   Additional Equipment:   Intra-op Plan:   Post-operative Plan:   Informed Consent: I have reviewed the patients History and Physical, chart, labs and discussed the procedure including the risks, benefits and alternatives for the proposed anesthesia with the patient or authorized representative who has indicated his/her understanding and acceptance.     Plan Discussed with: Anesthesiologist  Anesthesia Plan Comments:         Anesthesia Quick Evaluation

## 2014-10-06 NOTE — Progress Notes (Signed)
Pt assisted to bathroom, voided, shown pericare. Clean gown, and pad with icepack on.  Transferred via w/c to PP rm 338 in stable condition.

## 2014-10-07 LAB — CBC
HCT: 27.8 % — ABNORMAL LOW (ref 35.0–47.0)
Hemoglobin: 9.2 g/dL — ABNORMAL LOW (ref 12.0–16.0)
MCH: 31.2 pg (ref 26.0–34.0)
MCHC: 33.2 g/dL (ref 32.0–36.0)
MCV: 93.9 fL (ref 80.0–100.0)
Platelets: 97 10*3/uL — ABNORMAL LOW (ref 150–440)
RBC: 2.96 MIL/uL — ABNORMAL LOW (ref 3.80–5.20)
RDW: 12.3 % (ref 11.5–14.5)
WBC: 17.2 10*3/uL — ABNORMAL HIGH (ref 3.6–11.0)

## 2014-10-07 LAB — RPR: RPR Ser Ql: NONREACTIVE

## 2014-10-07 NOTE — Progress Notes (Signed)
  Post Partum Day 1 Subjective: no complaints, denies dizziness while ambulating  Objective: Blood pressure 109/68, pulse 71, temperature 97.9 F (36.6 C), temperature source Oral, resp. rate 18, height 5\' 5"  (1.651 m), weight 70.308 kg (155 lb), SpO2 98 %, unknown if currently breastfeeding.  Physical Exam:  General: alert Lochia: deferred d/t breastfeeding Uterine Fundus: deferred d/t breastfeeding Incision: deferred d/t breastfeeding DVT Evaluation: deferred d/t breastfeeding   Recent Labs  10/06/14 0930 10/07/14 0605  HGB 12.2 9.2*  HCT 37.0 27.8*    Assessment/Plan: Plan for discharge tomorrow and Lactation consult,  Blood loss anemia - Fe replacement   Marta AntuBrothers, Adelae Yodice 10/07/2014, 10:31 AM

## 2014-10-08 MED ORDER — IBUPROFEN 600 MG PO TABS: 600.0000 mg | ORAL_TABLET | Freq: Four times a day (QID) | ORAL | Status: DC

## 2014-10-08 MED ORDER — FERROUS SULFATE 325 (65 FE) MG PO TABS: 325.0000 mg | ORAL_TABLET | Freq: Two times a day (BID) | ORAL | Status: DC

## 2014-10-08 NOTE — Discharge Summary (Signed)
Obstetric Discharge Summary Reason for Admission: onset of labor Prenatal Procedures: none Intrapartum Procedures: spontaneous vaginal delivery Postpartum Procedures: none Complications-Operative and Postpartum: none HEMOGLOBIN  Date Value Ref Range Status  10/07/2014 9.2* 12.0 - 16.0 g/dL Final    Comment:     PT RECENTLY DELIVERED   HCT  Date Value Ref Range Status  10/07/2014 27.8* 35.0 - 47.0 % Final    Physical Exam:  General: alert, cooperative and appears stated age 86Lochia: Min Uterine Fundus: firm DVT Evaluation: No evidence of DVT seen on physical exam.  Discharge Diagnoses: Term Pregnancy-delivered  Discharge Information: Date: 10/08/2014 Activity: unrestricted and pelvic rest Diet: routine Medications: PNV and Iron Condition: stable Instructions: refer to practice specific booklet Discharge to: home Follow-up Information    Follow up with Jannet MantisSubudhi,  Courtney, CNM. Schedule an appointment as soon as possible for a visit in 6 weeks.   Specialty:  Certified Nurse Midwife   Why:  For Post Partum Check   Contact information:   [address] [place] [id number] [phone]       Newborn Data: Live born female  Birth Weight: 6 lb 14.6 oz (3135 g) APGAR: 9, 10  Home with mother.  Tomer Chalmers PAUL 10/08/2014, 7:57 AM

## 2014-10-08 NOTE — Anesthesia Postprocedure Evaluation (Signed)
  Anesthesia Post-op Note  Patient: Tonya Peterson  Procedure(s) Performed: * No procedures listed *  Anesthesia type:Epidural  Patient location: 338  Post pain: Pain level controlled  Post assessment: Post-op Vital signs reviewed, Patient's Cardiovascular Status Stable, Respiratory Function Stable, Patent Airway and No signs of Nausea or vomiting  Post vital signs: Reviewed and stable  Last Vitals:  Filed Vitals:   10/08/14 0751  BP: 115/66  Pulse: 56  Temp: 36.6 C  Resp: 20    Level of consciousness: awake, alert  and patient cooperative  Complications: No apparent anesthesia complications

## 2014-10-08 NOTE — Discharge Instructions (Signed)

## 2016-09-20 ENCOUNTER — Other Ambulatory Visit: Payer: Self-pay | Admitting: Family Medicine

## 2016-09-20 DIAGNOSIS — M8580 Other specified disorders of bone density and structure, unspecified site: Secondary | ICD-10-CM

## 2016-09-27 ENCOUNTER — Encounter: Payer: Self-pay | Admitting: Radiology

## 2016-09-27 ENCOUNTER — Ambulatory Visit
Admission: RE | Admit: 2016-09-27 | Discharge: 2016-09-27 | Disposition: A | Discharge status: Home / Self Care | Payer: BLUE CROSS/BLUE SHIELD | Source: Ambulatory Visit | Attending: Family Medicine | Admitting: Family Medicine

## 2016-09-27 DIAGNOSIS — M8580 Other specified disorders of bone density and structure, unspecified site: Secondary | ICD-10-CM | POA: Insufficient documentation

## 2016-10-11 DIAGNOSIS — M6289 Other specified disorders of muscle: Secondary | ICD-10-CM | POA: Insufficient documentation

## 2016-11-28 ENCOUNTER — Ambulatory Visit: Payer: BLUE CROSS/BLUE SHIELD | Attending: Family Medicine | Admitting: Physical Therapy

## 2016-11-28 DIAGNOSIS — R29898 Other symptoms and signs involving the musculoskeletal system: Secondary | ICD-10-CM | POA: Diagnosis present

## 2016-11-28 DIAGNOSIS — M6281 Muscle weakness (generalized): Secondary | ICD-10-CM | POA: Insufficient documentation

## 2016-11-28 DIAGNOSIS — M25511 Pain in right shoulder: Secondary | ICD-10-CM | POA: Insufficient documentation

## 2016-11-28 DIAGNOSIS — M791 Myalgia: Secondary | ICD-10-CM | POA: Diagnosis not present

## 2016-11-28 DIAGNOSIS — M533 Sacrococcygeal disorders, not elsewhere classified: Secondary | ICD-10-CM | POA: Diagnosis present

## 2016-11-28 DIAGNOSIS — G8929 Other chronic pain: Secondary | ICD-10-CM | POA: Insufficient documentation

## 2016-11-28 NOTE — Patient Instructions (Signed)
Prone Heel Press for strengthening sacro-iliac joints  1. Lie on your belly. If you have an arch in your low back or it feels umcomfortable, place a pillow under your low belly/hips to make sure your low back feel comfortable.   2. Place our forehead on top of your palms.      Widen your knees apart for starting position.   3. Inhale, feel belly and low back expand  4. Exhale, feel belly hug in, press heel together and count aloud for 5 sec. Then relax the heel squeezing.  Perform 10 reps of 5 sec holds. 2 sets/ day.    If you feel entire buttock tighten too much or feel low back pain, apply 50% less effort. As you press your heel together, you will feel as if your pubic bone (front of your pelvis) and sacrum (back of your pelvis) gentle move towards each other or your low abdominal muscles hug in more.   PUT HEAD DOWN, resting forehead on palms        Clam Shell 45 Degrees  ONly the R leg - lay on the L side  Lying with hips and knees bent 45, one pillow between knees and ankles. Lift knee with exhale. Be sure pelvis does not roll backward. Do not arch back. Do 10 times, each leg, 2 times per day.  http://ss.exer.us/75   Copyright  VHI. All rights reserved.    _____

## 2016-11-29 NOTE — Therapy (Signed)
[facility] MAIN [facility] SERVICES [address] [place], [place], [address] Phone: [phone]   Fax:  [phone]  Physical Therapy Evaluation  Patient Details  Name: Tonya Peterson MRN: [id number] Date of Birth: 12-16-1985 Referring Provider: Dr. Ludwig Clarks  Encounter Date: 11/28/2016      PT End of Session - 11/28/16 1523    Visit Number 1   Number of Visits 12   Date for PT Re-Evaluation 02/20/17   PT Start Time 1405   PT Stop Time 1515   PT Time Calculation (min) 70 min      Past Medical History:  Diagnosis Date  . ADD (attention deficit disorder)   . ADHD (attention deficit hyperactivity disorder) 05/2011  . Allergy   . Chronic kidney disease    left cyst, one kidney stone  . Depression   . Frequent headaches   . Skin abnormalities    precancerous moles    Past Surgical History:  Procedure Laterality Date  . gum graft    . ingrown toe nail Right    great toe  . NO PAST SURGERIES      There were no vitals filed for this visit.       Subjective Assessment - 11/28/16 1433    Subjective Pt reports pelvic issues after the L & D of her first child 2 years ago: 1) urinary leakage occurs when she is not able to make it to the bathroom in time, coughing  2) pelvic pain with functional activities and use of speculum during gynecological exams  3) bowel movements: pt has had to push pressure onto the scar tissue where her perineal tear is located to complete a bowel movement. She has to splint this way 98% of the time. Pt feels she has to get "everything at a certain angle to get clearance."  Bowel movements occur 1-2 x/ day. Pt had a hemmorrhoid during pregnancy.  4) LBP that occurred 2013 acround the same time as she had a bunion issue on her R foot on 1st toe. Pt received chiropractic Tx for a misaligned pelvis which improved her LBP but the LBP started again after 3rd trimester and after L &D.  Pain radiates down her front and  back R thigh above knee. 2/10. Pain with leaning, giving her son a bath, pain goes into R back spasms.      Pertinent History R foot pain due to toe nail fungus 2015 which has affect ability walk on the R foot , R foot bunion started 2013. Pt had a fall onto tailbone in middle/ high school without treatment.     Patient Stated Goals to have normal function and be back in the right place             [facility] PT Assessment - 11/29/16 1015      Assessment   Medical Diagnosis pelvic floor dysfunction    Referring Provider Dr. Ludwig Clarks     Precautions   Precautions None     Restrictions   Weight Bearing Restrictions No     Balance Screen   Has the patient fallen in the past 6 months No     Prior Function   Level of Independence Independent     Observation/Other Assessments   Observations flip flops, slumped sitting      Coordination   Gross Motor Movements are Fluid and Coordinated --  chest breathing   Fine Motor Movements are Fluid and Coordinated --  abdominal straining  w/ pelvic floor cues      Posture/Postural Control   Posture Comments lumbopelvic perturbations with leg movements      AROM   Overall AROM Comments reproducing radiating RLE pain with B sidebend       Strength   Overall Strength Comments hip flex/ext, knee flex/ext on R 4-/5, L 4+/5       Palpation   SI assessment  R ASIS more inferior, R SIJ limited in hip abd/add ( increased post Tx)             Objective measurements completed on examination: See above findings.        Pelvic Floor Special Questions - 11/29/16 0001    Diastasis Recti 3 fingers below and above umbilicus           [facility] Adult PT Treatment/Exercise - 11/29/16 1015      Neuro Re-ed    Neuro Re-ed Details  see pt instructions     Exercises   Exercises --  see pt instructions     Manual Therapy   Manual therapy comments R LE long axis distraction, MWM hip abd/add with inferior/superior mob of sacrum, rotational  mob                  PT Education - 11/29/16 1029    Education provided Yes   Education Details POC, anatomy, physiology, goals, HEP   Person(s) Educated Patient   Methods Explanation;Demonstration;Tactile cues;Verbal cues;Handout   Comprehension Returned demonstration;Verbalized understanding             PT Long Term Goals - 11/29/16 1030      PT LONG TERM GOAL #1   Title Pt will decrease her PFDI score from 62% to < 57% in order to improve pelvic floor function and return to ADLs   Time 12   Period Weeks   Status New     PT LONG TERM GOAL #2   Title Pt will decrease her PDI score from 60% to < 40% in order to improve QOL    Time 12   Period Weeks   Status New     PT LONG TERM GOAL #3   Title Pt will demo no pelvic obliquities and report no pain with trunk rotation B across 2 visits in order to progress to fitness exercises with less risk for injuries    Time 12   Period Weeks   Status New     PT LONG TERM GOAL #4   Title Pt will demo less fingers width at linea alba from 3 fingers to < 1 fingers in order to demo increased intraabdominal pressure system for minimizing LBP and incontinence.    Time 12   Period Weeks   Status New     PT LONG TERM GOAL #5   Title Pt will demo proper pelvic floor lengthening and contraction and to report no need for splinting to complete bowel movements 100% of the time across 1 week  in order to restore function of pelvic floor function   Time 12   Period Weeks   Status New     Additional Long Term Goals   Additional Long Term Goals Yes     PT LONG TERM GOAL #6   Title Pt will demo increased R LE strength 4-/5 to 4+/5 and show no lumbopelvic perturbaion with deep core level 2-3 5 reps in order to lift 31 year old child  and bath him in tub  without pain1  Time 12   Period Weeks   Status New                Plan - 11/29/16 1029    Clinical Impression Statement Pt is a 31 yo female reports pelvic floor  dysfunctions since the L & D of her child 2 years ago. Pt reports urge incontinence and SUI, difficulty with completing bowel movements with need to digital splinting, in addition to pain with sexual intercourse and gynecological exams with use of spectulum. Pt also reports radiating LBP along R LE to thigh level. Pt's clinical presentations include: limited spinal mobility, diastasis recti, dyscoordination and strength of pelvic floor mm, RLE weakness, pelvic obliquity, and  poor body mechanics which place strain on the abdominal/pelvic floor mm. These are deficits that indicate an ineffective intraabdominal pressure system associated with urinary leakage and LBP. Plan to assess pelvic floor to better understand the impact of her perineal scars on her pelvic floor function. Following today's Tx which addressed her pelvic obliquity, pt showed a more symmetrically aligned pelvis, increased mobility at her R sacroiliac joint, and increased RLE strength.      Clinical Presentation Evolving   Clinical Decision Making Moderate   Rehab Potential Good   PT Frequency 1x / week   PT Duration 12 weeks   PT Treatment/Interventions Aquatic Therapy;Moist Heat;Traction;Stair training;Functional mobility training;Therapeutic activities;Therapeutic exercise;Balance training;Cryotherapy;Electrical Stimulation;Neuromuscular re-education;Manual techniques;Patient/family education;Manual lymph drainage;Compression bandaging;Scar mobilization;Energy conservation;Passive range of motion;Taping   Consulted and Agree with Plan of Care Patient      Patient will benefit from skilled therapeutic intervention in order to improve the following deficits and impairments:  Pain, Improper body mechanics, Decreased scar mobility, Decreased coordination, Decreased mobility, Increased muscle spasms, Postural dysfunction, Decreased strength, Decreased range of motion, Decreased endurance, Decreased activity tolerance, Decreased balance,  Decreased safety awareness, Hypomobility  Visit Diagnosis: Muscle weakness (generalized)  Other symptoms and signs involving the musculoskeletal system  Sacrococcygeal disorders, not elsewhere classified     Problem List Patient Active Problem List   Diagnosis Date Noted  . Labor and delivery indication for care or intervention 10/06/2014  . Seasonal and perennial allergic rhinitis 05/14/2013  . ADD (attention deficit disorder) 02/04/2013  . GAD (generalized anxiety disorder) 02/04/2013  . Osteopenia 02/04/2013  . 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 Pain in joint, ankle and foot 02/04/2013    Mariane Masters ,PT, DPT, E-RYT  11/29/2016, 10:40 AM  [facility] MAIN [facility] SERVICES [address] [place], [place], [address] Phone: [phone]   Fax:  [phone]  Name: Tonya Peterson MRN: [id number] Date of Birth: [birth year]

## 2016-12-12 ENCOUNTER — Ambulatory Visit: Payer: BLUE CROSS/BLUE SHIELD | Admitting: Physical Therapy

## 2016-12-12 DIAGNOSIS — M6281 Muscle weakness (generalized): Secondary | ICD-10-CM | POA: Diagnosis not present

## 2016-12-12 DIAGNOSIS — R29898 Other symptoms and signs involving the musculoskeletal system: Secondary | ICD-10-CM

## 2016-12-12 DIAGNOSIS — M533 Sacrococcygeal disorders, not elsewhere classified: Secondary | ICD-10-CM

## 2016-12-12 NOTE — Therapy (Signed)
[place] MAIN [facility] SERVICES [address] [place], [place], [address] Phone: [phone]   Fax:  [phone]  Physical Therapy Treatment  Patient Details  Name: Maie Kesinger MRN: [id number] Date of Birth: 1985/09/12 Referring Provider: Dr. Bobette Mo  Encounter Date: 12/12/2016      PT End of Session - 12/12/16 1643    Visit Number 2   Number of Visits 12   Date for PT Re-Evaluation 02/20/17   PT Start Time 0277   PT Stop Time 4128   PT Time Calculation (min) 40 min   Activity Tolerance Patient tolerated treatment well;No increased pain   Behavior During Therapy WFL for tasks assessed/performed      Past Medical History:  Diagnosis Date  . ADD (attention deficit disorder)   . ADHD (attention deficit hyperactivity disorder) 05/2011  . Allergy   . Chronic kidney disease    left cyst, one kidney stone  . Depression   . Frequent headaches   . Skin abnormalities    precancerous moles    Past Surgical History:  Procedure Laterality Date  . gum graft    . ingrown toe nail Right    great toe  . NO PAST SURGERIES      There were no vitals filed for this visit.      Subjective Assessment - 12/12/16 1629    Subjective Pt reported she has had difficulty completely emptying her bowels. Squatty potty has helped to some extent. Pt 's R hip back pain improved by 50% since last session.    Pertinent History R foot pain due to toe nail fungus 2015 which has affect ability walk on the R foot , R foot bunion started 2013. Pt had a fall onto tailbone in middle/ high school without treatment.     Patient Stated Goals to have normal function and be back in the right place                       Pelvic Floor Special Questions - 12/12/16 1630    Pelvic Floor Internal Exam pt consented verbally without contraindications   Exam Type Vaginal   Palpation tenderness. fascial restrictions along5-7o clock 1st/ 2nd layer, R  obt int            [facility] Adult PT Treatment/Exercise - 12/12/16 1634      Neuro Re-ed    Neuro Re-ed Details  see pt instructions     Manual Therapy   Internal Pelvic Floor fascial release over perineal scar, MET at obt int R                 PT Education - 12/12/16 1641    Education provided Yes   Education Details HEP   Person(s) Educated Patient   Methods Explanation;Demonstration;Tactile cues;Verbal cues;Handout   Comprehension Verbalized understanding;Returned demonstration             PT Long Term Goals - 11/29/16 1030      PT LONG TERM GOAL #1   Title Pt will decrease her PFDI score from 62% to < 57% in order to improve pelvic floor function and return to ADLs   Time 12   Period Weeks   Status New     PT LONG TERM GOAL #2   Title Pt will decrease her Royal score from 60% to < 40% in order to improve QOL    Time 12   Period Weeks   Status New  PT LONG TERM GOAL #3   Title Pt will demo no pelvic obliquities and report no pain with trunk rotation B across 2 visits in order to progress to fitness exercises with less risk for injuries    Time 12   Period Weeks   Status New     PT LONG TERM GOAL #4   Title Pt will demo less fingers width at linea alba from 3 fingers to < 1 fingers in order to demo increased intraabdominal pressure system for minimizing LBP and incontinence.    Time 12   Period Weeks   Status New     PT LONG TERM GOAL #5   Title Pt will demo proper pelvic floor lengthening and contraction and to report no need for splinting to complete bowel movements 100% of the time across 1 week  in order to restore function of pelvic floor function   Time 12   Period Weeks   Status New     Additional Long Term Goals   Additional Long Term Goals Yes     PT LONG TERM GOAL #6   Title Pt will demo increased R LE strength 4-/5 to 4+/5 and show no lumbopelvic perturbaion with deep core level 2-3 5 reps in order to lift 8 year old child  and bath  him in tub  without pain1   Time 12   Period Weeks   Status New               Plan - 12/12/16 1650    Clinical Impression Statement Pt 's R hip back pain improved by 50% since last session. Pt's perineal scar restrictions and R sided tenderness decreased following Tx today. Initiated deep core cre strengthening and perineal stretches.  Plan to address diastasis recti at next session. Pt continues to benefit from skilled PT.     Rehab Potential Good   PT Frequency 1x / week   PT Duration 12 weeks   PT Treatment/Interventions Aquatic Therapy;Moist Heat;Traction;Stair training;Functional mobility training;Therapeutic activities;Therapeutic exercise;Balance training;Cryotherapy;Electrical Stimulation;Neuromuscular re-education;Manual techniques;Patient/family education;Manual lymph drainage;Compression bandaging;Scar mobilization;Energy conservation;Passive range of motion;Taping   Consulted and Agree with Plan of Care Patient      Patient will benefit from skilled therapeutic intervention in order to improve the following deficits and impairments:  Pain, Improper body mechanics, Decreased scar mobility, Decreased coordination, Decreased mobility, Increased muscle spasms, Postural dysfunction, Decreased strength, Decreased range of motion, Decreased endurance, Decreased activity tolerance, Decreased balance, Decreased safety awareness, Hypomobility  Visit Diagnosis: Other symptoms and signs involving the musculoskeletal system  Muscle weakness (generalized)  Sacrococcygeal disorders, not elsewhere classified     Problem List Patient Active Problem List   Diagnosis Date Noted  . Labor and delivery indication for care or intervention 10/06/2014  . Seasonal and perennial allergic rhinitis 05/14/2013  . ADD (attention deficit disorder) 02/04/2013  . GAD (generalized anxiety disorder) 02/04/2013  . Osteopenia 02/04/2013  . 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 Pain in  joint, ankle and foot 02/04/2013    Jerl Mina ,PT, DPT, E-RYT  12/12/2016, 4:51 PM  [place] MAIN [facility] SERVICES [address] [place], [place], [address] Phone: [phone]   Fax:  [phone]  Name: Eesha Schmaltz MRN: [id number] Date of Birth: [birth year]

## 2016-12-12 NOTE — Patient Instructions (Signed)
  Frog stretch: laying on belly with pillow under hips,  knees bent, inhale do nothing, exhale let ankles fall apart  10 reps x 3      You are now ready to begin training the deep core muscles system: diaphragm, transverse abdominis, pelvic floor . These muscles must work together as a team.           The key to these exercises to train the brain to coordinate the timing of these muscles and to have them turn on for long periods of time to hold you upright against gravity (especially important if you are on your feet all day).These muscles are postural muscles and play a role stabilizing your spine and bodyweight. By doing these repetitions slowly and correctly instead of doing crunches, you will achieve a flatter belly without a lower pooch. You are also placing your spine in a more neutral position and breathing properly which in turn, decreases your risk for problems related to your pelvic floor, abdominal, and low back such as pelvic organ prolapse, hernias, diastasis recti (separation of superficial muscles), disk herniations, spinal fractures. These exercises set a solid foundation for you to later progress to resistance/ strength training with therabands and weights and return to other typical fitness exercises with a stronger deeper core.   Do level 1 : 10 reps Do level 2: 10 reps (left and right = 1 rep) x 3 sets , 2 x day Do not progress to level 3 for 3-4 weeks. You know you are ready when you do not have any rocking of pelvis nor arching in your back

## 2016-12-22 ENCOUNTER — Ambulatory Visit: Payer: BLUE CROSS/BLUE SHIELD | Admitting: Physical Therapy

## 2016-12-22 DIAGNOSIS — M6281 Muscle weakness (generalized): Secondary | ICD-10-CM | POA: Diagnosis not present

## 2016-12-22 DIAGNOSIS — M533 Sacrococcygeal disorders, not elsewhere classified: Secondary | ICD-10-CM

## 2016-12-22 DIAGNOSIS — M25511 Pain in right shoulder: Secondary | ICD-10-CM

## 2016-12-22 DIAGNOSIS — R29898 Other symptoms and signs involving the musculoskeletal system: Secondary | ICD-10-CM

## 2016-12-22 DIAGNOSIS — G8929 Other chronic pain: Secondary | ICD-10-CM

## 2016-12-22 NOTE — Patient Instructions (Signed)
Figure -4 stretch seated or laying down   Deep core 1  and 2 in bed.

## 2016-12-23 NOTE — Therapy (Signed)
[facility] [address] [place], [place], [address] Phone: [phone]   Fax:  [phone]  Physical Therapy Treatment  Patient Details  Name: Tonya FairyBrittany Bullis Peterson MRN: [id number] Date of Birth: 12/28/1985 Referring Provider: Dr. Ludwig ClarksSantayana  Encounter Date: 12/22/2016      PT End of Session - 12/23/16 1546    Visit Number 3   Number of Visits 12   Date for PT Re-Evaluation 02/20/17   PT Start Time 1550   PT Stop Time 1650   PT Time Calculation (min) 60 min   Activity Tolerance Patient tolerated treatment well;No increased pain   Behavior During Therapy WFL for tasks assessed/performed      Past Medical History:  Diagnosis Date  . ADD (attention deficit disorder)   . ADHD (attention deficit hyperactivity disorder) 05/2011  . Allergy   . Chronic kidney disease    left cyst, one kidney stone  . Depression   . Frequent headaches   . Skin abnormalities    precancerous moles    Past Surgical History:  Procedure Laterality Date  . gum graft    . ingrown toe nail Right    great toe  . NO PAST SURGERIES      There were no vitals filed for this visit.      Subjective Assessment - 12/22/16 1553    Subjective Pt reports she is having a difficult time perform ing her exercises laying down due to interactions with the cat and baby. Pt would like to see if she can perform the deep core level 2 exercises in a seated position. Pt reports she has had burning and tingling in the R shoulder arm in the past year. Pt sleeps on her R side more and during her child's first year.      Pertinent History R foot pain due to toe nail fungus 2015 which has affect ability walk on the R foot , R foot bunion started 2013. Pt had a fall onto tailbone in middle/ high school without treatment.     Patient Stated Goals to have normal function and be back in the right place             [facility] Assessment - 12/23/16 1557      Observation/Other Assessments   Observations deep core seated position with report of pulling in the R shoulder causing numbness and tingling in her arms. Deferred pt to laying down in her bed for this exercise.       AROM   Overall AROM Comments no radiating RLE pain with B sidebend.  R UE radiating pain with hand behind head, back, and 90 shoulder abduction      Palpation   SI assessment  ASIS more symmetrical, SIJ mobility restored       Special Tests    Special Tests --  dermotome R C6 less. Myotome NL.                  Pelvic Floor Special Questions - 12/23/16 0001    Pelvic Floor Internal Exam pt consented verbally without contraindications   Exam Type Vaginal   Palpation tenderness at 7 o'clock. deferred to external Tx.             [facility] Adult [facility] Treatment/Exercise - 12/23/16 1544      Neuro Re-ed    Neuro Re-ed Details  see pt instructions     Manual Therapy   Internal Pelvic Floor fascial  release on externally laterally to labia minora, ischicavernosus, R, and then internally at perineal scar R, 7 o'clock,                 PT Education - 12/23/16 1546    Education provided Yes   Education Details HEP   Person(s) Educated Patient   Methods Explanation;Demonstration;Tactile cues;Verbal cues;Handout   Comprehension Verbalized understanding;Returned demonstration             PT Long Term Goals - 12/23/16 1549      PT LONG TERM GOAL #1   Title Pt will decrease her PFDI score from 62% to < 57% in order to improve pelvic floor function and return to ADLs   Time 12   Period Weeks   Status On-going     PT LONG TERM GOAL #2   Title Pt will decrease her PDI score from 60% to < 40% in order to improve QOL    Time 12   Period Weeks   Status On-going     PT LONG TERM GOAL #3   Title Pt will demo no pelvic obliquities and report no pain with trunk rotation B across 2 visits in order to progress to fitness exercises with less risk for injuries     Time 12   Period Weeks   Status New     PT LONG TERM GOAL #4   Title Pt will demo less fingers width at linea alba from 3 fingers to < 1 fingers in order to demo increased intraabdominal pressure system for minimizing LBP and incontinence.    Time 12   Period Weeks   Status On-going     PT LONG TERM GOAL #5   Title Pt will demo proper pelvic floor lengthening and contraction and to report no need for splinting to complete bowel movements 100% of the time across 1 week  in order to restore function of pelvic floor function   Time 12   Period Weeks   Status On-going     PT LONG TERM GOAL #6   Title Pt will demo increased R LE strength 4-/5 to 4+/5 and show no lumbopelvic perturbaion with deep core level 2-3 5 reps in order to lift 45 year old child  and bath him in tub  without pain1   Time 12   Period Weeks   Status New               Plan - 12/23/16 1547    Clinical Impression Statement Pt is progress well with no radiating low back pain to heel when performing spinal side flexion movements bilaterally compared to last session. Pt's pelvic girdle was more symmetrical as well. Today, PT adddressed  R sided pelvic floor mm tensions externally and internally. Pt demo'd less tenderness and had less tensions  post Tx and was able to demo increased lengthening of pelvic floor mm. HEP was modified to minimize shoulder pain.   Pt has had R shoulder pain with numbness and tingling for the past year  With reaching overhead with heavy objects include her son. Assessment today showed decreased sensation at R C6 dermatomal level and radiating pain to hand with reaching behind neck, back, and shoulder abduction to 90 deg. Adding a new body part to POC to treat R shoulder pain at next session.    Pt continues to benefit from skilled PT.    Rehab Potential Good   PT Frequency 1x / week   PT Duration 12 weeks  PT Treatment/Interventions Aquatic Therapy;Moist Heat;Traction;Stair  training;Functional mobility training;Therapeutic activities;Therapeutic exercise;Balance training;Cryotherapy;Electrical Stimulation;Neuromuscular re-education;Manual techniques;Patient/family education;Manual lymph drainage;Compression bandaging;Scar mobilization;Energy conservation;Passive range of motion;Taping   Consulted and Agree with Plan of Care Patient      Patient will benefit from skilled therapeutic intervention in order to improve the following deficits and impairments:  Pain, Improper body mechanics, Decreased scar mobility, Decreased coordination, Decreased mobility, Increased muscle spasms, Postural dysfunction, Decreased strength, Decreased range of motion, Decreased endurance, Decreased activity tolerance, Decreased balance, Decreased safety awareness, Hypomobility  Visit Diagnosis: Other symptoms and signs involving the musculoskeletal system  Muscle weakness (generalized)  Sacrococcygeal disorders, not elsewhere classified     Problem List Patient Active Problem List   Diagnosis Date Noted  . Labor and delivery indication for care or intervention 10/06/2014  . Seasonal and perennial allergic rhinitis 05/14/2013  . ADD (attention deficit disorder) 02/04/2013  . GAD (generalized anxiety disorder) 02/04/2013  . Osteopenia 02/04/2013  . 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 Pain in joint, ankle and foot 02/04/2013    Mariane MastersYeung,Shin Yiing 12/23/2016, 3:59 PM  [facility] [address] [place], [place], [address] Phone: [phone]   Fax:  [phone]  Name: Tonya FairyBrittany Bullis Peterson MRN: [id number] Date of Birth: 10/16/1985

## 2016-12-28 ENCOUNTER — Ambulatory Visit: Payer: BLUE CROSS/BLUE SHIELD | Attending: Family Medicine | Admitting: Physical Therapy

## 2016-12-28 DIAGNOSIS — M533 Sacrococcygeal disorders, not elsewhere classified: Secondary | ICD-10-CM

## 2016-12-28 DIAGNOSIS — M25511 Pain in right shoulder: Secondary | ICD-10-CM | POA: Insufficient documentation

## 2016-12-28 DIAGNOSIS — M6281 Muscle weakness (generalized): Secondary | ICD-10-CM

## 2016-12-28 DIAGNOSIS — R29898 Other symptoms and signs involving the musculoskeletal system: Secondary | ICD-10-CM | POA: Diagnosis present

## 2016-12-28 DIAGNOSIS — G8929 Other chronic pain: Secondary | ICD-10-CM | POA: Diagnosis present

## 2016-12-28 NOTE — Patient Instructions (Signed)
  Ways to stretch perineal scar and lengthen pelvic floor   Extended side angle :  Feet are hip width apart, L foot one behind like you are on ski tracks,  R knee bent over ankle but not more forward then the ankle.  Make sure 50% weight is in the front foot/leg , 50% weight is the back foot/ leg    Rest R forearm lightly on top of thigh,  L hand on L hip.  Inhale lengthen spine,   Exhale turn navel to the L then the ribcage turns, look at the other wall.  Keep maintaining  50% weight is in the front foot/leg , 50% weight is the back foot/ leg  And make sure the front knee is still pointed in the toe line of the 2nd toe.   3 breaths here.    _______  Use this pose for picking up toys as well   ______ When playing on the ground with son:   Sitting on the floor with two towels folded, sit on front half of the towel to tilt pelvic bowl forward, lifting buttocks up and scotting them out of the way so you are on sitting bones and pelvic bowl tilts a little more forward  Half crossed leg or crossed leg,

## 2016-12-28 NOTE — Therapy (Addendum)
[place] MAIN [facility] SERVICES [address] [place], [place], [address] Phone: [phone]   Fax:  [phone]  Physical Therapy Treatment  Patient Details  Name: Tonya Peterson MRN: [id number] Date of Birth: 21-Oct-1985 Referring Provider: Dr. Bobette Mo  Encounter Date: 12/28/2016      PT End of Session - 12/28/16 1459    Visit Number 4   Number of Visits 12   Date for PT Re-Evaluation 02/20/17   PT Start Time 8786   PT Stop Time 1400   PT Time Calculation (min) 57 min   Activity Tolerance Patient tolerated treatment well;No increased pain   Behavior During Therapy WFL for tasks assessed/performed      Past Medical History:  Diagnosis Date  . ADD (attention deficit disorder)   . ADHD (attention deficit hyperactivity disorder) 05/2011  . Allergy   . Chronic kidney disease    left cyst, one kidney stone  . Depression   . Frequent headaches   . Skin abnormalities    precancerous moles    Past Surgical History:  Procedure Laterality Date  . gum graft    . ingrown toe nail Right    great toe  . NO PAST SURGERIES      There were no vitals filed for this visit.      Subjective Assessment - 12/28/16 1453    Subjective Pt reports she is still having difficulty eliminating bowels. She feels like something is like it is about to tear at the perineum   Pertinent History R foot pain due to toe nail fungus 2015 which has affect ability walk on the R foot , R foot bunion started 2013. Pt had a fall onto tailbone in middle/ high school without treatment.     Patient Stated Goals to have normal function and be back in the right place                       Pelvic Floor Special Questions - 12/28/16 0001    Diastasis Recti 1 finger width    Internal pelvic floor assessment with pt's verbal consent. Noted increased scar restrictions at 6-7 o clock       [facility] Adult PT Treatment/Exercise - 12/28/16 1459      Therapeutic Activites    Therapeutic Activities --  see pt instructions      Neuro Re-ed    Neuro Re-ed Details  see pt instructions     Manual Therapy   Internal Pelvic Floor fascial release on externally laterally to labia minora, ischicavernosus, R, and then internally at perineal scar R, 7 o'clock,                      PT Long Term Goals - 12/28/16 1502      PT LONG TERM GOAL #1   Title Pt will decrease her PFDI score from 62% to < 57% in order to improve pelvic floor function and return to ADLs   Time 12   Period Weeks   Status On-going     PT LONG TERM GOAL #2   Title Pt will decrease her [place] score from 60% to < 40% in order to improve QOL    Time 12   Period Weeks   Status On-going     PT LONG TERM GOAL #3   Title Pt will demo no pelvic obliquities and report no pain with trunk rotation B across 2 visits in order  to progress to fitness exercises with less risk for injuries    Time 12   Period Weeks   Status Achieved     PT LONG TERM GOAL #4   Title Pt will demo less fingers width at linea alba from 3 fingers to < 1 fingers in order to demo increased intraabdominal pressure system for minimizing LBP and incontinence.    Time 12   Period Weeks   Status Achieved     PT LONG TERM GOAL #5   Title Pt will demo proper pelvic floor lengthening and contraction and to report no need for splinting to complete bowel movements 100% of the time across 1 week  in order to restore function of pelvic floor function   Time 12   Period Weeks   Status Partially Met     PT LONG TERM GOAL #6   Title Pt will demo increased R LE strength 4-/5 to 4+/5 and show no lumbopelvic perturbaion with deep core level 2-3 5 reps in order to lift 77 year old child  and bath him in tub  without pain1   Time 12   Period Weeks   Status On-going               Plan - 12/28/16 1500    Clinical Impression Statement Pt demo'd decreased perineal scar restrictions and no abdominal  separation today. Plan to continue decreasing perineal scar restrictions to help pt achieve goal related to complete emptying of bowel movements.  Added functional perineal stretches into HEP to make exercises achievable when caring for son.  Pt is continues to benefit from skilled PT.    Rehab Potential Good   PT Frequency 1x / week   PT Duration 12 weeks   PT Treatment/Interventions Aquatic Therapy;Moist Heat;Traction;Stair training;Functional mobility training;Therapeutic activities;Therapeutic exercise;Balance training;Cryotherapy;Electrical Stimulation;Neuromuscular re-education;Manual techniques;Patient/family education;Manual lymph drainage;Compression bandaging;Scar mobilization;Energy conservation;Passive range of motion;Taping   Consulted and Agree with Plan of Care Patient      Patient will benefit from skilled therapeutic intervention in order to improve the following deficits and impairments:  Pain, Improper body mechanics, Decreased scar mobility, Decreased coordination, Decreased mobility, Increased muscle spasms, Postural dysfunction, Decreased strength, Decreased range of motion, Decreased endurance, Decreased activity tolerance, Decreased balance, Decreased safety awareness, Hypomobility  Visit Diagnosis: Other symptoms and signs involving the musculoskeletal system  Muscle weakness (generalized)  Sacrococcygeal disorders, not elsewhere classified  Chronic right shoulder pain     Problem List Patient Active Problem List   Diagnosis Date Noted  . Labor and delivery indication for care or intervention 10/06/2014  . Seasonal and perennial allergic rhinitis 05/14/2013  . ADD (attention deficit disorder) 02/04/2013  . GAD (generalized anxiety disorder) 02/04/2013  . Osteopenia 02/04/2013  . 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 Pain in joint, ankle and foot 02/04/2013    Jerl Mina ,PT, DPT, E-RYT  12/28/2016, 3:04 PM  [place] MAIN [facility] SERVICES [address] [place], [place], [address] Phone: [phone]   Fax:  [phone]  Name: Rashanna Christiana MRN: [id number] Date of Birth: 1986-01-29

## 2017-01-10 ENCOUNTER — Ambulatory Visit: Payer: BLUE CROSS/BLUE SHIELD | Admitting: Physical Therapy

## 2017-01-10 DIAGNOSIS — R29898 Other symptoms and signs involving the musculoskeletal system: Secondary | ICD-10-CM | POA: Diagnosis not present

## 2017-01-10 DIAGNOSIS — G8929 Other chronic pain: Secondary | ICD-10-CM

## 2017-01-10 DIAGNOSIS — M6281 Muscle weakness (generalized): Secondary | ICD-10-CM

## 2017-01-10 DIAGNOSIS — M25511 Pain in right shoulder: Secondary | ICD-10-CM

## 2017-01-10 DIAGNOSIS — M533 Sacrococcygeal disorders, not elsewhere classified: Secondary | ICD-10-CM

## 2017-01-10 NOTE — Patient Instructions (Addendum)
Focus on the elevator imagery to lengthen pelvic floor fully and positions for optimal lengthening   Discussed the book by Juventino SlovakKathe Wallace

## 2017-01-11 NOTE — Therapy (Signed)
[place] MAIN [facility] SERVICES [address] [place], [place], [address] Phone: [phone]   Fax:  [phone]  Physical Therapy Treatment  Patient Details  Name: Tonya Peterson MRN: [id number] Date of Birth: 02-23-1986 Referring Provider: Dr. Bobette Mo  Encounter Date: 01/10/2017      PT End of Session - 01/10/17 1207    Visit Number 5   Number of Visits 12   Date for PT Re-Evaluation 02/20/17   PT Start Time 1115   PT Stop Time 6962   PT Time Calculation (min) 50 min   Activity Tolerance Patient tolerated treatment well;No increased pain   Behavior During Therapy WFL for tasks assessed/performed      Past Medical History:  Diagnosis Date  . ADD (attention deficit disorder)   . ADHD (attention deficit hyperactivity disorder) 05/2011  . Allergy   . Chronic kidney disease    left cyst, one kidney stone  . Depression   . Frequent headaches   . Skin abnormalities    precancerous moles    Past Surgical History:  Procedure Laterality Date  . gum graft    . ingrown toe nail Right    great toe  . NO PAST SURGERIES      There were no vitals filed for this visit.      Subjective Assessment - 01/10/17 1207    Subjective Pt reports she is still having less difficulty eliminating bowels with 50-75% improvement. Urinary urgency is better as well and she has no leakage before getting to the toilet bathroom. pt has been able to return to activities with your family less pelvic pain.     Pertinent History R foot pain due to toe nail fungus 2015 which has affect ability walk on the R foot , R foot bunion started 2013. Pt had a fall onto tailbone in middle/ high school without treatment.     Patient Stated Goals to have normal function and be back in the right place                       Pelvic Floor Special Questions - 01/11/17 1544    Pelvic Floor Internal Exam pt consented verbally without contraindications   Exam Type Vaginal   Palpation tenderness at 7 o'clock     Strength fair squeeze, definite lift           [facility] Adult PT Treatment/Exercise - 01/11/17 1549      Manual Therapy   Internal Pelvic Floor perineal scar massage with MWM and guided breathing to lengthening of pelvic floor                      PT Long Term Goals - 12/28/16 1502      PT LONG TERM GOAL #1   Title Pt will decrease her PFDI score from 62% to < 57% in order to improve pelvic floor function and return to ADLs   Time 12   Period Weeks   Status On-going     PT LONG TERM GOAL #2   Title Pt will decrease her [place] score from 60% to < 40% in order to improve QOL    Time 12   Period Weeks   Status On-going     PT LONG TERM GOAL #3   Title Pt will demo no pelvic obliquities and report no pain with trunk rotation B across 2 visits in order to progress to fitness exercises with  less risk for injuries    Time 12   Period Weeks   Status Achieved     PT LONG TERM GOAL #4   Title Pt will demo less fingers width at linea alba from 3 fingers to < 1 fingers in order to demo increased intraabdominal pressure system for minimizing LBP and incontinence.    Time 12   Period Weeks   Status Achieved     PT LONG TERM GOAL #5   Title Pt will demo proper pelvic floor lengthening and contraction and to report no need for splinting to complete bowel movements 100% of the time across 1 week  in order to restore function of pelvic floor function   Time 12   Period Weeks   Status Partially Met     PT LONG TERM GOAL #6   Title Pt will demo increased R LE strength 4-/5 to 4+/5 and show no lumbopelvic perturbaion with deep core level 2-3 5 reps in order to lift 57 year old child  and bath him in tub  without pain1   Time 12   Period Weeks   Status On-going               Plan - 01/11/17 1549    Clinical Impression Statement Pt 's perineal scar restrictions decreased post manual Tx and pt was able to demo  increased pelvic floor lengthening and improved coordination. Anticipate pt will continue to have improved pelvic floor for bowel and sexual function with this restored pelvic floor ROM and coordination.  Plan to address R shoulder pain at future sessions.   Rehab Potential Good   PT Frequency 1x / week   PT Duration 12 weeks   PT Treatment/Interventions Aquatic Therapy;Moist Heat;Traction;Stair training;Functional mobility training;Therapeutic activities;Therapeutic exercise;Balance training;Cryotherapy;Electrical Stimulation;Neuromuscular re-education;Manual techniques;Patient/family education;Manual lymph drainage;Compression bandaging;Scar mobilization;Energy conservation;Passive range of motion;Taping   Consulted and Agree with Plan of Care Patient      Patient will benefit from skilled therapeutic intervention in order to improve the following deficits and impairments:  Pain, Improper body mechanics, Decreased scar mobility, Decreased coordination, Decreased mobility, Increased muscle spasms, Postural dysfunction, Decreased strength, Decreased range of motion, Decreased endurance, Decreased activity tolerance, Decreased balance, Decreased safety awareness, Hypomobility  Visit Diagnosis: Other symptoms and signs involving the musculoskeletal system  Muscle weakness (generalized)  Sacrococcygeal disorders, not elsewhere classified  Chronic right shoulder pain     Problem List Patient Active Problem List   Diagnosis Date Noted  . Labor and delivery indication for care or intervention 10/06/2014  . Seasonal and perennial allergic rhinitis 05/14/2013  . ADD (attention deficit disorder) 02/04/2013  . GAD (generalized anxiety disorder) 02/04/2013  . Osteopenia 02/04/2013  . 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 Pain in joint, ankle and foot 02/04/2013    Jerl Mina ,PT, DPT, E-RYT  01/11/2017, 3:52 PM  [place] MAIN [facility]  SERVICES [address] [place], [place], [address] Phone: [phone]   Fax:  [phone]  Name: Tonya Peterson MRN: [id number] Date of Birth: [birth year]

## 2017-01-17 ENCOUNTER — Ambulatory Visit: Payer: BLUE CROSS/BLUE SHIELD | Admitting: Physical Therapy

## 2017-01-17 DIAGNOSIS — M6281 Muscle weakness (generalized): Secondary | ICD-10-CM

## 2017-01-17 DIAGNOSIS — R29898 Other symptoms and signs involving the musculoskeletal system: Secondary | ICD-10-CM

## 2017-01-17 DIAGNOSIS — M25511 Pain in right shoulder: Secondary | ICD-10-CM

## 2017-01-17 DIAGNOSIS — M533 Sacrococcygeal disorders, not elsewhere classified: Secondary | ICD-10-CM

## 2017-01-17 DIAGNOSIS — G8929 Other chronic pain: Secondary | ICD-10-CM

## 2017-01-17 NOTE — Patient Instructions (Signed)
Mini squats 10 reps to stretch rectal muscles  Neck: laying down _ 6 directions ( 5 x)   _direction of northwest to [employer] ( 5x)   _ add in gentle pressing down of skull and shoulder blades with exhalation during deep core level 2

## 2017-01-17 NOTE — Therapy (Signed)
[place] MAIN [facility] SERVICES [place], [place], [address] Phone: [phone]   Fax:  [phone]  Physical Therapy Treatment / Progress Note  Patient Details  Name: Tonya Peterson MRN: [id number] Date of Birth: Jan 15, 1986 Referring Provider: Dr. Bobette Mo  Encounter Date: 01/17/2017      PT End of Session - 01/17/17 1207    Visit Number 6   Number of Visits 12   Date for PT Re-Evaluation 02/20/17   PT Start Time 1110   PT Stop Time 1209   PT Time Calculation (min) 59 min   Activity Tolerance Patient tolerated treatment well;No increased pain   Behavior During Therapy WFL for tasks assessed/performed      Past Medical History:  Diagnosis Date  . ADD (attention deficit disorder)   . ADHD (attention deficit hyperactivity disorder) 05/2011  . Allergy   . Chronic kidney disease    left cyst, one kidney stone  . Depression   . Frequent headaches   . Skin abnormalities    precancerous moles    Past Surgical History:  Procedure Laterality Date  . gum graft    . ingrown toe nail Right    great toe  . NO PAST SURGERIES      There were no vitals filed for this visit.      Subjective Assessment - 01/17/17 1113    Subjective Pt reported 90% improvement with pelvic pain. Pt had one day with urinary leakage. Pt is having a much easier time with bowel movements. Pt feels there is still a little bit of blockage and tightness. Pt has to help it along 50% of the time.    Pertinent History R foot pain due to toe nail fungus 2015 which has affect ability walk on the R foot , R foot bunion started 2013. Pt had a fall onto tailbone in middle/ high school without treatment.     Patient Stated Goals to have normal function and be back in the right place             [facility] PT Assessment - 01/17/17 1135      Observation/Other Assessments   Observations horizontal abduction, elb flex R, with numbness to fingers digit  II-III, ( Post Tx: no numbness but starting to radiate with no cue for cervical retraction, onset later with cue for cervical retraction     Skin Integrity       AROM   Overall AROM Comments cervical ext/flex 60 deg, sidebend L 45 deg, R 50 deg, L rotation 55 deg, R 60 deg       Palpation   Spinal mobility C1 shifted laterally with tenderness and pain, ( post Tx: medially aligned and no pain, no numbness in R arm)                   Pelvic Floor Special Questions - 01/17/17 1133    Pelvic Floor Internal Exam pt consented verbally without contraindications   Exam Type Rectal   Palpation tightness and restrictions at 4-7 o'clock at [place] Adult PT Treatment/Exercise - 01/17/17 1134      Manual Therapy   Internal Pelvic Floor intrarectal: restriction releases with coordinated breathing       Treatment:  See pt instructions for exercises          PT Education - 01/17/17 1207    Education provided Yes  Education Details HEP   Person(s) Educated Patient   Methods Explanation;Demonstration;Tactile cues;Verbal cues;Handout   Comprehension Returned demonstration;Verbalized understanding             PT Long Term Goals - 01/17/17 1139      PT LONG TERM GOAL #1   Title Pt will decrease her PFDI score from 62% to < 57% in order to improve pelvic floor function and return to ADLs   Time 12   Period Weeks   Status On-going     PT LONG TERM GOAL #2   Title Pt will decrease her [place] score from 60% to < 40% in order to improve QOL   ( 8/21: 19%)    Time 12   Period Weeks   Status Achieved     PT LONG TERM GOAL #3   Title Pt will demo no pelvic obliquities and report no pain with trunk rotation B across 2 visits in order to progress to fitness exercises with less risk for injuries    Time 12   Period Weeks   Status Achieved     PT LONG TERM GOAL #4   Title Pt will demo less fingers width at linea alba from 3 fingers to < 1 fingers in order to  demo increased intraabdominal pressure system for minimizing LBP and incontinence.    Time 12   Period Weeks   Status Achieved     PT LONG TERM GOAL #5   Title Pt will demo proper pelvic floor lengthening and contraction and to report no need for splinting to complete bowel movements 100% of the time across 1 week  in order to restore function of pelvic floor function   Time 12   Period Weeks   Status Partially Met     Additional Long Term Goals   Additional Long Term Goals Yes     PT LONG TERM GOAL #6   Title Pt will demo increased R LE strength 4-/5 to 4+/5 and show no lumbopelvic perturbaion with deep core level 2-3 5 reps in order to lift 10 year old child  and bath him in tub  without pain1   Time 12   Period Weeks   Status On-going     PT LONG TERM GOAL #7   Title Pt will increase her cervical ROM  with sidebend L and rotation L by 5 deg      Baseline  cerivcal sidebend 45 deg, R 50 deg, L rotation 55 deg, R 60 deg  ( post Tx: 8/21: 55 deg sidebend B, rotation 60 deg B)    Time 12   Period Weeks   Status Achieved   Target Date 03/20/17     PT LONG TERM GOAL #8   Title Pt will demo no radiating pain when holding her arm up in horizontal adduction, hand at sternum for 6 sec in order to lift and perform ADLs    Time 12   Period Weeks   Status New               Plan - 01/17/17 1213    Clinical Impression Statement Pt has 50% of her goals with 6 sessions of pelvic health PT. Pt 's Pain Disability Index score decreased from 60% to 19% with pt reporting significantly decreased pelvic pain and return to functional acitvities with her husband.  Other pelvic floor deficits that have also improved include bowel elimination and urinary leakage but pt will benefit from continued skilled PT to  make further improvements. Pt demo'd resolved diastasis recti, decreased perineal scar restrictions, and improved deep core coordination/ strength with manual Tx and neuro-muscular  re-education. Today, pt's demo'd less radiating pain down her R shoulder and hand along with restored ROM of the cervical spine in all directions  following manual Tx which addressed the R deivation of  C1.Anticiapte pt will acheive her goals with skilled PT.      Rehab Potential Good   PT Frequency 1x / week   PT Duration 12 weeks   PT Treatment/Interventions Aquatic Therapy;Moist Heat;Traction;Stair training;Functional mobility training;Therapeutic activities;Therapeutic exercise;Balance training;Cryotherapy;Electrical Stimulation;Neuromuscular re-education;Manual techniques;Patient/family education;Manual lymph drainage;Compression bandaging;Scar mobilization;Energy conservation;Passive range of motion;Taping   Consulted and Agree with Plan of Care Patient      Patient will benefit from skilled therapeutic intervention in order to improve the following deficits and impairments:  Pain, Improper body mechanics, Decreased scar mobility, Decreased coordination, Decreased mobility, Increased muscle spasms, Postural dysfunction, Decreased strength, Decreased range of motion, Decreased endurance, Decreased activity tolerance, Decreased balance, Decreased safety awareness, Hypomobility  Visit Diagnosis: Muscle weakness (generalized)  Sacrococcygeal disorders, not elsewhere classified  Chronic right shoulder pain  Other symptoms and signs involving the musculoskeletal system     Problem List Patient Active Problem List   Diagnosis Date Noted  . Labor and delivery indication for care or intervention 10/06/2014  . Seasonal and perennial allergic rhinitis 05/14/2013  . ADD (attention deficit disorder) 02/04/2013  . GAD (generalized anxiety disorder) 02/04/2013  . Osteopenia 02/04/2013  . 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 Pain in joint, ankle and foot 02/04/2013    Jerl Mina ,PT, DPT, E-RYT  01/17/2017, 12:27 PM  [place] MAIN  [facility] SERVICES [address] [place], [place], [address] Phone: [phone]   Fax:  [phone]  Name: Tonya Peterson MRN: [id number] Date of Birth: [birth year]

## 2017-01-26 ENCOUNTER — Ambulatory Visit: Payer: BLUE CROSS/BLUE SHIELD | Admitting: Physical Therapy

## 2017-01-26 VITALS — BP 102/60

## 2017-01-26 DIAGNOSIS — M6281 Muscle weakness (generalized): Secondary | ICD-10-CM

## 2017-01-26 DIAGNOSIS — M25511 Pain in right shoulder: Secondary | ICD-10-CM

## 2017-01-26 DIAGNOSIS — M533 Sacrococcygeal disorders, not elsewhere classified: Secondary | ICD-10-CM

## 2017-01-26 DIAGNOSIS — R29898 Other symptoms and signs involving the musculoskeletal system: Secondary | ICD-10-CM

## 2017-01-26 DIAGNOSIS — G8929 Other chronic pain: Secondary | ICD-10-CM

## 2017-01-26 NOTE — Patient Instructions (Addendum)
band under heels while laying on back w/ knees bent  "W" exercise  10 reps x 2 sets   Band is placed under feet, knees bent, feet are hip width apart Hold band with thumbs point out, keep upper arm and elbow touching the bed the whole time  - inhale and then exhale pull bands by bending elbows hands move in a "w"  (feel shoulder blades squeezing)    ________________  Lat pull down  band on other side of the door knob Squeeze shoulder blades together first,  Inhale, exhale, pull bands past your pocket without lifting shoulders up  10 x 2 right foot forward,   10 x 2 left foot forward  Feet hip width apart    _________________  Seated:  Hands by side, pressing shoulders down, palms flat, fingers at 90 degs from midline   5 sec, 10 x     _________________  Lifting son with elbows by side, shoulder blades down

## 2017-01-26 NOTE — Therapy (Addendum)
[place] MAIN [facility] SERVICES [place], [place], [address] Phone: [phone]   Fax:  [phone]  Physical Therapy Treatment / Progress Note  Patient Details  Name: Tonya Peterson MRN: [id number] Date of Birth: 26-Jul-1985 Referring Provider: Dr. Bobette Mo  Encounter Date: 01/26/2017      PT End of Session - 01/26/17 0949    Visit Number 7   Number of Visits 12   Date for PT Re-Evaluation 02/20/17   PT Start Time 0905   PT Stop Time 1000   PT Time Calculation (min) 55 min   Activity Tolerance Patient tolerated treatment well;No increased pain   Behavior During Therapy WFL for tasks assessed/performed      Past Medical History:  Diagnosis Date  . ADD (attention deficit disorder)   . ADHD (attention deficit hyperactivity disorder) 05/2011  . Allergy   . Chronic kidney disease    left cyst, one kidney stone  . Depression   . Frequent headaches   . Skin abnormalities    precancerous moles    Past Surgical History:  Procedure Laterality Date  . gum graft    . ingrown toe nail Right    great toe  . NO PAST SURGERIES      Vitals:   01/26/17 1000  BP: 102/60        Subjective Assessment - 01/26/17 0909    Subjective Pt reported her R arm pain improved by 50% and it shoots down tot he elbow level instead of the hands. But the pain is more intense at the neck and shoulder.  Pt reported she rememebered sometime within the last 6 months, she had turned her head a certain and noticed a snapping sensation her R neck muscle and then a bulge above the collar bone. This bulge has been present to now without changes.  Pt describeds the pain at the bulge and surrounding area feels like a constant ache at rest (4-5/10)  and that that she can not fully relax her R arm down. It has a burning pain 8-9/10 when it is getting used.    Pertinent History R foot pain due to toe nail fungus 2015 which has affect ability walk on the  R foot , R foot bunion started 2013. Pt had a fall onto tailbone in middle/ high school without treatment.     Patient Stated Goals to have normal function and be back in the right place             [facility] PT Assessment - 01/26/17 1001      Functional Tests   Functional tests --  no pain with proper scapular stabilizing cues w/ lifting     Palpation   Spinal mobility no devaitaions to C1, no mm tensions at occiptal mm                     [facility] Adult PT Treatment/Exercise - 01/26/17 1304      Therapeutic Activites    Therapeutic Activities --  see pt instructions      Neuro Re-ed    Neuro Re-ed Details  see pt instructions     Exercises   Exercises --  see pt instructions                PT Education - 01/26/17 0948    Education provided Yes   Education Details HEP   Person(s) Educated Patient   Methods Explanation;Demonstration;Tactile cues;Verbal cues;Handout  Comprehension Returned demonstration;Verbalized understanding             PT Long Term Goals - 01/26/17 1006      PT LONG TERM GOAL #1   Title Pt will decrease her PFDI score from 62% to < 57% in order to improve pelvic floor function and return to ADLs   Time 12   Period Weeks   Status On-going     PT LONG TERM GOAL #2   Title Pt will decrease her PDI score from 60% to < 40% in order to improve QOL   ( 8/21: 19%)    Time 12   Period Weeks   Status Achieved     PT LONG TERM GOAL #3   Title Pt will demo no pelvic obliquities and report no pain with trunk rotation B across 2 visits in order to progress to fitness exercises with less risk for injuries    Time 12   Period Weeks   Status Achieved     PT LONG TERM GOAL #4   Title Pt will demo less fingers width at linea alba from 3 fingers to < 1 fingers in order to demo increased intraabdominal pressure system for minimizing LBP and incontinence.    Time 12   Period Weeks   Status Achieved     PT LONG TERM GOAL #5   Title  Pt will demo proper pelvic floor lengthening and contraction and to report no need for splinting to complete bowel movements 100% of the time across 1 week  in order to restore function of pelvic floor function   Time 12   Period Weeks   Status Partially Met     PT LONG TERM GOAL #6   Title Pt will demo increased R LE strength 4-/5 to 4+/5 and show no lumbopelvic perturbaion with deep core level 2-3 5 reps in order to lift 2 year old child  and bath him in tub  without pain1   Time 12   Period Weeks   Status On-going     PT LONG TERM GOAL #7   Title Pt will increase her cervical ROM  with sidebend L and rotation L by 5 deg      Baseline  cerivcal sidebend 45 deg, R 50 deg, L rotation 55 deg, R 60 deg  ( post Tx: 8/21: 55 deg sidebend B, rotation 60 deg B)    Time 12   Period Weeks   Status Achieved     PT LONG TERM GOAL #8   Title Pt will demo no radiating pain when holding her arm up in horizontal adduction, hand at sternum for 6 sec in order to lift and perform ADLs    Time 12   Period Weeks   Status Partially Met               Plan - 01/26/17 0950    Clinical Impression Statement Pt has acheived 4/8 goals and is progressing well towards her remaining goals. Pt reports 95% improvement with her pelvic pain and reports less radiating pain down her R arm. Pt's perineal scar restrictions, diastasis recti, and pelvic obliquities have improved signifiicantly. Pt's RUE  radiating pain is centralizing from fingers to elbow after last visit. Manual Tx at last session treated a laterally deviated C1 vertebra. Pt reports less pain with proper scapular stabilizing coordination training and is begining with more scapular stabilizing mm strengthening today. Pt continues to benefit from skilled PT.    Rehab   Potential Good   PT Frequency 1x / week   PT Duration 12 weeks   PT Treatment/Interventions Aquatic Therapy;Moist Heat;Traction;Stair training;Functional mobility training;Therapeutic  activities;Therapeutic exercise;Balance training;Cryotherapy;Electrical Stimulation;Neuromuscular re-education;Manual techniques;Patient/family education;Manual lymph drainage;Compression bandaging;Scar mobilization;Energy conservation;Passive range of motion;Taping   Consulted and Agree with Plan of Care Patient      Patient will benefit from skilled therapeutic intervention in order to improve the following deficits and impairments:  Pain, Improper body mechanics, Decreased scar mobility, Decreased coordination, Decreased mobility, Increased muscle spasms, Postural dysfunction, Decreased strength, Decreased range of motion, Decreased endurance, Decreased activity tolerance, Decreased balance, Decreased safety awareness, Hypomobility  Visit Diagnosis: Sacrococcygeal disorders, not elsewhere classified  Muscle weakness (generalized)  Chronic right shoulder pain  Other symptoms and signs involving the musculoskeletal system     Problem List Patient Active Problem List   Diagnosis Date Noted  . Labor and delivery indication for care or intervention 10/06/2014  . Seasonal and perennial allergic rhinitis 05/14/2013  . ADD (attention deficit disorder) 02/04/2013  . GAD (generalized anxiety disorder) 02/04/2013  . Osteopenia 02/04/2013  . 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 Pain in joint, ankle and foot 02/04/2013    Yeung,Shin Yiing ,PT, DPT, E-RYT  01/26/2017, 1:05 PM  [place] [facility] MAIN REHAB SERVICES [address] [place], [place], [address] Phone: [phone]   Fax:  [phone]  Name: Tonya Peterson MRN: [id number] Date of Birth: 12/22/1985   

## 2017-02-09 ENCOUNTER — Ambulatory Visit: Payer: BLUE CROSS/BLUE SHIELD | Attending: Family Medicine | Admitting: Physical Therapy

## 2017-02-09 DIAGNOSIS — R29898 Other symptoms and signs involving the musculoskeletal system: Secondary | ICD-10-CM | POA: Diagnosis present

## 2017-02-09 DIAGNOSIS — M6281 Muscle weakness (generalized): Secondary | ICD-10-CM | POA: Diagnosis present

## 2017-02-09 DIAGNOSIS — M25511 Pain in right shoulder: Secondary | ICD-10-CM | POA: Insufficient documentation

## 2017-02-09 DIAGNOSIS — G8929 Other chronic pain: Secondary | ICD-10-CM | POA: Insufficient documentation

## 2017-02-09 DIAGNOSIS — M533 Sacrococcygeal disorders, not elsewhere classified: Secondary | ICD-10-CM | POA: Insufficient documentation

## 2017-02-09 NOTE — Therapy (Signed)
[place] MAIN [facility] SERVICES [address] [place], [place], [address] Phone: [phone]   Fax:  [phone]  Physical Therapy Treatment  Patient Details  Name: Tonya Peterson MRN: [id number] Date of Birth: July 05, 1985 Referring Provider: Dr. Bobette Mo  Encounter Date: 02/09/2017      PT End of Session - 02/09/17 1004    Visit Number 8   Number of Visits 12   Date for PT Re-Evaluation 02/20/17   PT Start Time 0919   PT Stop Time 1005   PT Time Calculation (min) 46 min   Activity Tolerance Patient tolerated treatment well;No increased pain   Behavior During Therapy WFL for tasks assessed/performed      Past Medical History:  Diagnosis Date  . ADD (attention deficit disorder)   . ADHD (attention deficit hyperactivity disorder) 05/2011  . Allergy   . Chronic kidney disease    left cyst, one kidney stone  . Depression   . Frequent headaches   . Skin abnormalities    precancerous moles    Past Surgical History:  Procedure Laterality Date  . gum graft    . ingrown toe nail Right    great toe  . NO PAST SURGERIES      There were no vitals filed for this visit.      Subjective Assessment - 02/09/17 0922    Subjective Pt reproted her shoulder is better but still hurts. Pt no longer has numbness/ tingling going to her finger tips.  Now it mainly stops at her elbow.  Pt is being more aware of her body's signals and noticed the pain with  folding laundry , brushing hair.    Pertinent History R foot pain due to toe nail fungus 2015 which has affect ability walk on the R foot , R foot bunion started 2013. Pt had a fall onto tailbone in middle/ high school without treatment.     Patient Stated Goals to have normal function and be back in the right place             [facility] PT Assessment - 02/09/17 0955      Palpation   Spinal mobility no deviations of spine    Palpation comment increased mm tensions at interspinals B  lower cervical and lower thoracic and R scalenes                      [facility] Adult PT Treatment/Exercise - 02/09/17 4259      Exercises   Exercises --  see pt instructions     Manual Therapy   Manual therapy comments traction at occiput, STM  at lower cervical/ upper  thoracic  B and R scalenes                 PT Education - 02/09/17 1004    Education provided Yes   Education Details HEP   Person(s) Educated Patient   Methods Explanation;Demonstration;Tactile cues;Verbal cues;Handout   Comprehension Returned demonstration;Verbalized understanding             PT Long Term Goals - 01/26/17 1006      PT LONG TERM GOAL #1   Title Pt will decrease her PFDI score from 62% to < 57% in order to improve pelvic floor function and return to ADLs   Time 12   Period Weeks   Status On-going     PT LONG TERM GOAL #2   Title Pt will decrease her [place]  score from 60% to < 40% in order to improve QOL   ( 8/21: 19%)    Time 12   Period Weeks   Status Achieved     PT LONG TERM GOAL #3   Title Pt will demo no pelvic obliquities and report no pain with trunk rotation B across 2 visits in order to progress to fitness exercises with less risk for injuries    Time 12   Period Weeks   Status Achieved     PT LONG TERM GOAL #4   Title Pt will demo less fingers width at linea alba from 3 fingers to < 1 fingers in order to demo increased intraabdominal pressure system for minimizing LBP and incontinence.    Time 12   Period Weeks   Status Achieved     PT LONG TERM GOAL #5   Title Pt will demo proper pelvic floor lengthening and contraction and to report no need for splinting to complete bowel movements 100% of the time across 1 week  in order to restore function of pelvic floor function   Time 12   Period Weeks   Status Partially Met     PT LONG TERM GOAL #6   Title Pt will demo increased R LE strength 4-/5 to 4+/5 and show no lumbopelvic perturbaion with deep core  level 2-3 5 reps in order to lift 13 year old child  and bath him in tub  without pain1   Time 12   Period Weeks   Status On-going     PT LONG TERM GOAL #7   Title Pt will increase her cervical ROM  with sidebend L and rotation L by 5 deg      Baseline  cerivcal sidebend 45 deg, R 50 deg, L rotation 55 deg, R 60 deg  ( post Tx: 8/21: 55 deg sidebend B, rotation 60 deg B)    Time 12   Period Weeks   Status Achieved     PT LONG TERM GOAL #8   Title Pt will demo no radiating pain when holding her arm up in horizontal adduction, hand at sternum for 6 sec in order to lift and perform ADLs    Time 12   Period Weeks   Status Partially Met               Plan - 02/09/17 1215    Clinical Impression Statement Pt is progressing well from last session which has helped her radiating UE pain to centralize from fingers tips to the elbow. Pt showed improved cervical mobility but had increased mm tensions along lower cervical and upper thoracic spinal segments. Pt demo'd proper scapular motor controlw ith lifting 10lb weight and simulated lifting her toddler. Pt's HEP included more antigrativity spinal strengthening and scapular strengthening exercises.  Plan to reassess pelvic pain and bowel function at future session to progress further. Currently focusing on treatments to minimzie radicular UE pain to help pt perform her ADLs. Pt continues to benefit from skilled PT.       Rehab Potential Good   PT Frequency 1x / week   PT Duration 12 weeks   PT Treatment/Interventions Aquatic Therapy;Moist Heat;Traction;Stair training;Functional mobility training;Therapeutic activities;Therapeutic exercise;Balance training;Cryotherapy;Electrical Stimulation;Neuromuscular re-education;Manual techniques;Patient/family education;Manual lymph drainage;Compression bandaging;Scar mobilization;Energy conservation;Passive range of motion;Taping   Consulted and Agree with Plan of Care Patient      Patient will benefit  from skilled therapeutic intervention in order to improve the following deficits and impairments:  Pain, Improper  body mechanics, Decreased scar mobility, Decreased coordination, Decreased mobility, Increased muscle spasms, Postural dysfunction, Decreased strength, Decreased range of motion, Decreased endurance, Decreased activity tolerance, Decreased balance, Decreased safety awareness, Hypomobility  Visit Diagnosis: Sacrococcygeal disorders, not elsewhere classified  Muscle weakness (generalized)  Chronic right shoulder pain  Other symptoms and signs involving the musculoskeletal system     Problem List Patient Active Problem List   Diagnosis Date Noted  . Labor and delivery indication for care or intervention 10/06/2014  . Seasonal and perennial allergic rhinitis 05/14/2013  . ADD (attention deficit disorder) 02/04/2013  . GAD (generalized anxiety disorder) 02/04/2013  . Osteopenia 02/04/2013  . 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 Pain in joint, ankle and foot 02/04/2013    Jerl Mina ,PT, DPT, E-RYT  02/09/2017, 12:28 PM  [place] MAIN [facility] SERVICES [address] [place], [place], [address] Phone: [phone]   Fax:  [phone]  Name: Tonya Peterson MRN: [id number] Date of Birth: [birth year]

## 2017-02-09 NOTE — Patient Instructions (Addendum)
locust :   On belly Shoulder down and back, chin tucked, exhale, lifting chest and head to stay parallel while spreading fingers active towards feet  3 sec,x 5 -> 10  reps     Bridging  Position:  Elbows bent, knees hip width apart, heels under knees,   Inhale , do nothing, exhale, lift buttocks up while, pressing shoulders, back of head and upper arm to floor  without wobblyness of pelvis while pressing arms and shoulders, feet into floor/ bed  5 secs  10 x 2 sets     Doorway: One foot in front of other hip width apart  3 sec holds , shoudlers squeeze together and down, pushing fists against door frame

## 2017-02-23 ENCOUNTER — Ambulatory Visit: Payer: BLUE CROSS/BLUE SHIELD | Admitting: Physical Therapy

## 2017-02-23 DIAGNOSIS — R29898 Other symptoms and signs involving the musculoskeletal system: Secondary | ICD-10-CM

## 2017-02-23 DIAGNOSIS — M6281 Muscle weakness (generalized): Secondary | ICD-10-CM

## 2017-02-23 DIAGNOSIS — G8929 Other chronic pain: Secondary | ICD-10-CM

## 2017-02-23 DIAGNOSIS — M533 Sacrococcygeal disorders, not elsewhere classified: Secondary | ICD-10-CM

## 2017-02-23 DIAGNOSIS — M25511 Pain in right shoulder: Secondary | ICD-10-CM

## 2017-02-23 NOTE — Patient Instructions (Addendum)
10 reps of R side bend of neck  While on back     band under heels while laying on back w/ knees bent  "W" exercise  10 reps x 2 sets   Band is placed under feet, knees bent, feet are hip width apart Hold band with thumbs point out, keep upper arm and elbow touching the bed the whole time  - inhale and then exhale pull bands by bending elbows hands move in a "w"  (feel shoulder blades squeezing)    ________ Table position, toes tucks, knees and hip aligned Mini push ups   Spider woman hands  Shoulders down and back  Hug pencils in armpits metaphor  " Game with [employer] " dinosaurs eating grass -3 reps    _____________   no more "tree frog" method to pick up Tonya Peterson where he hangs off of your neck Instead   Minisquat: Scoot buttocks back slight, hinge like you are looking at your reflection on a pond  Knees behind toes,  Inhale to "smell flowers"  Exhale on the rise "like rocket"  Do not lock knees, have more weight across ballmounds of feet, toes relaxed   While picking him up on exhale (try "elevator" game with him)

## 2017-02-23 NOTE — Therapy (Signed)
[place] MAIN [facility] SERVICES [place], [place], [address] Phone: [phone]   Fax:  [phone]  Physical Therapy Treatment / Progress Note  Patient Details  Name: Tonya Peterson MRN: [id number] Date of Birth: [birth year] Referring Provider: Dr. Bobette Mo  Encounter Date: 02/23/2017      PT End of Session - 02/23/17 0958    Visit Number 9   Number of Visits 12   Date for PT Re-Evaluation 05/10/17   PT Start Time 0915   PT Stop Time 1010   PT Time Calculation (min) 55 min   Activity Tolerance Patient tolerated treatment well;No increased pain   Behavior During Therapy WFL for tasks assessed/performed      Past Medical History:  Diagnosis Date  . ADD (attention deficit disorder)   . ADHD (attention deficit hyperactivity disorder) 05/2011  . Allergy   . Chronic kidney disease    left cyst, one kidney stone  . Depression   . Frequent headaches   . Skin abnormalities    precancerous moles    Past Surgical History:  Procedure Laterality Date  . gum graft    . ingrown toe nail Right    great toe  . NO PAST SURGERIES      There were no vitals filed for this visit.      Subjective Assessment - 02/23/17 0920    Subjective Pt notices she is more stiff in her neck and soreness in her shoulder after sleeping on the air mattress. Pt 's radiating pain down no longer goes to her finger tips and it still stays above the elbow and occurs with lifting. The pain is more faint when it occurs.  Bowel movements have been easier but has had urinary leakage before getting to the toilet.     Pertinent History R foot pain due to toe nail fungus 2015 which has affect ability walk on the R foot , R foot bunion started 2013. Pt had a fall onto tailbone in middle/ high school without treatment.     Patient Stated Goals to have normal function and be back in the right place             [facility] PT Assessment - 02/23/17 9371       Observation/Other Assessments   Observations horizontal abduction, elb flex R, no pain      Functional Tests   Functional tests --  mini squat with poor knee alignment      Other:   Other/ Comments alignment poor and overuse of upprt trap with mini push ups on knees      AROM   Overall AROM Comments cervical ROM normal      Palpation   Spinal mobility minor R deviation of C3                   Pelvic Floor Special Questions - 02/23/17 0947    Pelvic Floor Internal Exam pt consented verbally without contraindications   Exam Type Vaginal   Palpation tenderness / tensions at scar toward iliococcygeus/ obt int R    Strength fair squeeze, definite lift           [facility] Adult PT Treatment/Exercise - 02/23/17 0948      Therapeutic Activites    Therapeutic Activities --  see pt instructions      Neuro Re-ed    Neuro Re-ed Details  see pt instructions     Manual Therapy   Internal Pelvic  Floor perineal scar massage                     PT Long Term Goals - 01/26/17 1006      PT LONG TERM GOAL #1   Title Pt will decrease her PFDI score from 62% to < 57% in order to improve pelvic floor function and return to ADLs   Time 12   Period Weeks   Status On-going     PT LONG TERM GOAL #2   Title Pt will decrease her [place] score from 60% to < 40% in order to improve QOL   ( 8/21: 19%)    Time 12   Period Weeks   Status Achieved     PT LONG TERM GOAL #3   Title Pt will demo no pelvic obliquities and report no pain with trunk rotation B across 2 visits in order to progress to fitness exercises with less risk for injuries    Time 12   Period Weeks   Status Achieved     PT LONG TERM GOAL #4   Title Pt will demo less fingers width at linea alba from 3 fingers to < 1 fingers in order to demo increased intraabdominal pressure system for minimizing LBP and incontinence.    Time 12   Period Weeks   Status Achieved     PT LONG TERM GOAL #5   Title Pt will  demo proper pelvic floor lengthening and contraction and to report no need for splinting to complete bowel movements 100% of the time across 1 week  in order to restore function of pelvic floor function   Time 12   Period Weeks   Status Partially Met     PT LONG TERM GOAL #6   Title Pt will demo increased R LE strength 4-/5 to 4+/5 and show no lumbopelvic perturbaion with deep core level 2-3 5 reps in order to lift 49 year old child  and bath him in tub  without pain1   Time 12   Period Weeks   Status On-going     PT LONG TERM GOAL #7   Title Pt will increase her cervical ROM  with sidebend L and rotation L by 5 deg      Baseline  cerivcal sidebend 45 deg, R 50 deg, L rotation 55 deg, R 60 deg  ( post Tx: 8/21: 55 deg sidebend B, rotation 60 deg B)    Time 12   Period Weeks   Status Achieved     PT LONG TERM GOAL #8   Title Pt will demo no radiating pain when holding her arm up in horizontal adduction, hand at sternum for 6 sec in order to lift and perform ADLs    Time 12   Period Weeks   Status Achieved               Plan - 02/23/17 1031    Clinical Impression Statement Pt has achieved 5/8 her goals and is progressing well towards her remaining goals, She has less pelvic pain and shoulder pain . Pt showed significantly decreased pelvic floor mm tensions and tenderness , a more upright posture, increased cervical ROM, and no pain with shoulder adduction and flexion.  Pt's radiating pain down R arm is improving and no longer in her fingers but is located above her elbow with less intensity.  Pt's remaining issues that will be addressed at future sessions include perineal scar restrictions, urge urinary incontinence,  and decreasing pt's radiating pain with lifting. Progressed pt with neck and shoudler strengthening and to against gravity position with modified push up with proper scapular control. Modified pt's method of lifting her son to minimzie worsening of her neck and shoulder  issues.Pt demo'd techniques correctly.  Pt continues to benefit from skilled PT.     Rehab Potential Good   PT Frequency 1x / week   PT Duration 12 weeks   PT Treatment/Interventions Aquatic Therapy;Moist Heat;Traction;Stair training;Functional mobility training;Therapeutic activities;Therapeutic exercise;Balance training;Cryotherapy;Electrical Stimulation;Neuromuscular re-education;Manual techniques;Patient/family education;Manual lymph drainage;Compression bandaging;Scar mobilization;Energy conservation;Passive range of motion;Taping   Consulted and Agree with Plan of Care Patient      Patient will benefit from skilled therapeutic intervention in order to improve the following deficits and impairments:  Pain, Improper body mechanics, Decreased scar mobility, Decreased coordination, Decreased mobility, Increased muscle spasms, Postural dysfunction, Decreased strength, Decreased range of motion, Decreased endurance, Decreased activity tolerance, Decreased balance, Decreased safety awareness, Hypomobility  Visit Diagnosis: Muscle weakness (generalized)  Sacrococcygeal disorders, not elsewhere classified  Chronic right shoulder pain  Other symptoms and signs involving the musculoskeletal system     Problem List Patient Active Problem List   Diagnosis Date Noted  . Labor and delivery indication for care or intervention 10/06/2014  . Seasonal and perennial allergic rhinitis 05/14/2013  . ADD (attention deficit disorder) 02/04/2013  . GAD (generalized anxiety disorder) 02/04/2013  . Osteopenia 02/04/2013  . 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 Pain in joint, ankle and foot 02/04/2013    Jerl Mina ,PT, DPT, E-RYT  02/23/2017, 10:39 AM  [place] MAIN [facility] SERVICES [place], [place], [address] Phone: [phone]   Fax:  [phone]  Name: Deeanna Beightol MRN: [id number] Date of Birth: May 11, 1986

## 2017-03-29 ENCOUNTER — Ambulatory Visit: Payer: BLUE CROSS/BLUE SHIELD | Admitting: Physical Therapy

## 2017-04-11 ENCOUNTER — Ambulatory Visit: Payer: BLUE CROSS/BLUE SHIELD | Attending: Family Medicine | Admitting: Physical Therapy

## 2017-04-11 DIAGNOSIS — M533 Sacrococcygeal disorders, not elsewhere classified: Secondary | ICD-10-CM | POA: Diagnosis present

## 2017-04-11 DIAGNOSIS — G8929 Other chronic pain: Secondary | ICD-10-CM | POA: Diagnosis present

## 2017-04-11 DIAGNOSIS — R29898 Other symptoms and signs involving the musculoskeletal system: Secondary | ICD-10-CM | POA: Diagnosis present

## 2017-04-11 DIAGNOSIS — M25511 Pain in right shoulder: Secondary | ICD-10-CM | POA: Insufficient documentation

## 2017-04-11 DIAGNOSIS — M6281 Muscle weakness (generalized): Secondary | ICD-10-CM | POA: Diagnosis not present

## 2017-04-11 NOTE — Patient Instructions (Signed)
Seated :  Perpendicular to chair: Outer leg in a lunge position , tuck toes, heel back  And bring knee back to the front next to he other     10 reps   Each side   ______  Sitting on toilet, find anterior tilt of the pelvis, lean slight forward, feet propped on squatty potty

## 2017-04-12 NOTE — Therapy (Signed)
[place] MAIN [facility] SERVICES [address] [place], [place], [address] Phone: [phone]   Fax:  [phone]  Physical Therapy Treatment  Patient Details  Name: Tonya Peterson MRN: [id number] Date of Birth: 1985/08/23 Referring Provider: Dr. Bobette Mo   Encounter Date: 04/11/2017  PT End of Session - 04/12/17 1253    Visit Number  10    Number of Visits  12    Date for PT Re-Evaluation  05/10/17    PT Start Time  1440    PT Stop Time  1530    PT Time Calculation (min)  50 min    Activity Tolerance  Patient tolerated treatment well;No increased pain    Behavior During Therapy  WFL for tasks assessed/performed       Past Medical History:  Diagnosis Date  . ADD (attention deficit disorder)   . ADHD (attention deficit hyperactivity disorder) 05/2011  . Allergy   . Chronic kidney disease    left cyst, one kidney stone  . Depression   . Frequent headaches   . Skin abnormalities    precancerous moles    Past Surgical History:  Procedure Laterality Date  . gum graft    . ingrown toe nail Right    great toe  . NO PAST SURGERIES      There were no vitals filed for this visit.  Subjective Assessment - 04/11/17 1447    Subjective  Pt reports she still has to push at her perineum and R side of her anus with bowel movements but has been better when she started at the beginning of PT.  Her neck and radiating pain down the arms have resolved.     Pertinent History  R foot pain due to toe nail fungus 2015 which has affect ability walk on the R foot , R foot bunion started 2013. Pt had a fall onto tailbone in middle/ high school without treatment.      Patient Stated Goals  to have normal function and be back in the right place                    Pelvic Floor Special Questions - 04/12/17 1256    Pelvic Floor Internal Exam  pt consented verbally without contraindications    Exam Type  Vaginal;Rectal    Palpation   tenderness / tensions R perinal scar, ischioanal fossa (externally) and internally: L posterior puborectalis     Strength  fair squeeze, definite lift        [facility] Adult PT Treatment/Exercise - 04/12/17 1258      Exercises   Exercises  -- see pt instructions      Manual Therapy   Internal Pelvic Floor  perineal scar massage, STM external and internal  at problem areas noted in assessment              PT Education - 04/12/17 1252    Education provided  Yes    Education Details  HEP    Person(s) Educated  Patient    Methods  Explanation;Demonstration;Tactile cues;Verbal cues;Handout    Comprehension  Returned demonstration;Verbalized understanding          PT Long Term Goals - 04/11/17 1447      PT LONG TERM GOAL #1   Title  Pt will decrease her PFDI score from 62% to < 57% in order to improve pelvic floor function and return to ADLs (11/13: 48%)     Time  12    Period  Weeks    Status  Achieved      PT LONG TERM GOAL #2   Title  Pt will decrease her [place] score from 60% to < 40% in order to improve QOL   ( 8/21: 19%)     Time  12    Period  Weeks    Status  Achieved      PT LONG TERM GOAL #3   Title  Pt will demo no pelvic obliquities and report no pain with trunk rotation B across 2 visits in order to progress to fitness exercises with less risk for injuries     Time  12    Period  Weeks    Status  Achieved      PT LONG TERM GOAL #4   Title  Pt will demo less fingers width at linea alba from 3 fingers to < 1 fingers in order to demo increased intraabdominal pressure system for minimizing LBP and incontinence.     Time  12    Period  Weeks    Status  Achieved      PT LONG TERM GOAL #5   Title  Pt will demo proper pelvic floor lengthening and contraction and to report no need for splinting to complete bowel movements 100% of the time across 1 week  in order to restore function of pelvic floor function    Time  12    Period  Weeks    Status  Partially Met       PT LONG TERM GOAL #6   Title  Pt will demo increased R LE strength 4-/5 to 4+/5 and show no lumbopelvic perturbaion with deep core level 2-3 5 reps in order to lift 55 year old child  and bath him in tub  without pain1    Time  12    Period  Weeks    Status  On-going      PT LONG TERM GOAL #7   Title  Pt will increase her cervical ROM  with sidebend L and rotation L by 5 deg       Baseline   cerivcal sidebend 45 deg, R 50 deg, L rotation 55 deg, R 60 deg  ( post Tx: 8/21: 55 deg sidebend B, rotation 60 deg B) .  11/13: cervical sidebend B 50 deg, rotation 65 deg B     Time  12    Period  Weeks    Status  Achieved      PT LONG TERM GOAL #8   Title  Pt will demo no radiating pain when holding her arm up in horizontal adduction, hand at sternum for 6 sec in order to lift and perform ADLs     Time  12    Period  Weeks    Status  Achieved            Plan - 04/12/17 1302    Clinical Impression Statement  Pt is progressing well with one remaining goal to achieve. Addressed further on her difficulty with bowel elimination with manual Tx to decrease perineal scar and pelvic floor mm tensions through external and intrarectal assessment. Pt tolerated without complaints and demo'd increased pelvic floor lengtheing following Tx. Pt's neck and shoulder issues have resolved over the past month. Pt will require 1-2 more sessions of skilled PT  before d/c.     Rehab Potential  Good    PT Frequency  1x / week  PT Duration  12 weeks    PT Treatment/Interventions  Aquatic Therapy;Moist Heat;Traction;Stair training;Functional mobility training;Therapeutic activities;Therapeutic exercise;Balance training;Cryotherapy;Electrical Stimulation;Neuromuscular re-education;Manual techniques;Patient/family education;Manual lymph drainage;Compression bandaging;Scar mobilization;Energy conservation;Passive range of motion;Taping    Consulted and Agree with Plan of Care  Patient       Patient will benefit from  skilled therapeutic intervention in order to improve the following deficits and impairments:  Pain, Improper body mechanics, Decreased scar mobility, Decreased coordination, Decreased mobility, Increased muscle spasms, Postural dysfunction, Decreased strength, Decreased range of motion, Decreased endurance, Decreased activity tolerance, Decreased balance, Decreased safety awareness, Hypomobility  Visit Diagnosis: Muscle weakness (generalized)  Sacrococcygeal disorders, not elsewhere classified  Chronic right shoulder pain  Other symptoms and signs involving the musculoskeletal system     Problem List Patient Active Problem List   Diagnosis Date Noted  . Labor and delivery indication for care or intervention 10/06/2014  . Seasonal and perennial allergic rhinitis 05/14/2013  . ADD (attention deficit disorder) 02/04/2013  . GAD (generalized anxiety disorder) 02/04/2013  . Osteopenia 02/04/2013  . 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 Pain in joint, ankle and foot 02/04/2013    Jerl Mina ,PT, DPT, E-RYT  04/12/2017, 1:03 PM  [place] MAIN [facility] SERVICES [address] [place], [place], [address] Phone: [phone]   Fax:  [phone]  Name: Tonya Peterson MRN: [id number] Date of Birth: [birth year]

## 2017-05-05 ENCOUNTER — Ambulatory Visit: Payer: BLUE CROSS/BLUE SHIELD | Attending: Family Medicine | Admitting: Physical Therapy

## 2017-05-05 DIAGNOSIS — M6281 Muscle weakness (generalized): Secondary | ICD-10-CM | POA: Insufficient documentation

## 2017-05-05 DIAGNOSIS — M25511 Pain in right shoulder: Secondary | ICD-10-CM | POA: Insufficient documentation

## 2017-05-05 DIAGNOSIS — M791 Myalgia, unspecified site: Secondary | ICD-10-CM | POA: Diagnosis not present

## 2017-05-05 DIAGNOSIS — R29898 Other symptoms and signs involving the musculoskeletal system: Secondary | ICD-10-CM

## 2017-05-05 DIAGNOSIS — M533 Sacrococcygeal disorders, not elsewhere classified: Secondary | ICD-10-CM | POA: Insufficient documentation

## 2017-05-05 DIAGNOSIS — G8929 Other chronic pain: Secondary | ICD-10-CM | POA: Diagnosis present

## 2017-05-05 NOTE — Therapy (Signed)
[place] MAIN [facility] SERVICES [address] [place], [place], [address] Phone: [phone]   Fax:  [phone]  Physical Therapy Treatment  Patient Details  Name: Tonya Peterson MRN: [id number] Date of Birth: 1986/05/22 Referring Provider: Dr. Bobette Mo   Encounter Date: 05/05/2017  PT End of Session - 05/05/17 1107    Visit Number  11    Number of Visits  --    Date for PT Re-Evaluation .07/28/17   PT Start Time  1010    PT Stop Time  1105    PT Time Calculation (min)  55 min    Activity Tolerance  Patient tolerated treatment well;No increased pain    Behavior During Therapy  WFL for tasks assessed/performed       Past Medical History:  Diagnosis Date  . ADD (attention deficit disorder)   . ADHD (attention deficit hyperactivity disorder) 05/2011  . Allergy   . Chronic kidney disease    left cyst, one kidney stone  . Depression   . Frequent headaches   . Skin abnormalities    precancerous moles    Past Surgical History:  Procedure Laterality Date  . gum graft    . ingrown toe nail Right    great toe  . NO PAST SURGERIES      There were no vitals filed for this visit.  Subjective Assessment - 05/05/17 1014    Subjective  Pt reports bowel movements were better by 50% but she is still having to push at the perineum .     Pertinent History  R foot pain due to toe nail fungus 2015 which has affect ability walk on the R foot , R foot bunion started 2013. Pt had a fall onto tailbone in middle/ high school without treatment.      Patient Stated Goals  to have normal function and be back in the right place          [facility] PT Assessment - 05/05/17 1416      Observation/Other Assessments   Observations  minimal cues for mini squat alignment and scap retraction               Pelvic Floor Special Questions - 05/05/17 1051    External Palpation  tenderness / tensions R perinal scar, ischioanal fossa (externally)      Pelvic Floor Internal Exam  pt consented verbally without contraindications    Exam Type  Rectal    Palpation  L posterior puborectalis     Strength  good squeeze, good lift, able to hold agaisnt strong resistance                PT Education - 05/05/17 1107    Education provided  Yes    Education Details  HEP    Person(s) Educated  Patient    Methods  Explanation;Demonstration;Tactile cues;Verbal cues;Handout    Comprehension  Verbalized understanding;Returned demonstration          PT Long Term Goals - 05/05/17 1051      PT LONG TERM GOAL #1   Title  Pt will decrease her PFDI score from 62% to < 57% in order to improve pelvic floor function and return to ADLs (11/13: 48%)     Time  12    Period  Weeks    Status  Achieved      PT LONG TERM GOAL #2   Title  Pt will decrease her [place] score from 60% to <  40% in order to improve QOL   ( 8/21: 19%)     Time  12    Period  Weeks    Status  Achieved      PT LONG TERM GOAL #3   Title  Pt will demo no pelvic obliquities and report no pain with trunk rotation B across 2 visits in order to progress to fitness exercises with less risk for injuries     Time  12    Period  Weeks    Status  Achieved      PT LONG TERM GOAL #4   Title  Pt will demo less fingers width at linea alba from 3 fingers to < 1 fingers in order to demo increased intraabdominal pressure system for minimizing LBP and incontinence.     Time  12    Period  Weeks    Status  Achieved      PT LONG TERM GOAL #5   Title  Pt will demo proper pelvic floor lengthening and contraction and to report no need for splinting to complete bowel movements 100% of the time across 1 week  in order to restore function of pelvic floor function    Time  12    Period  Weeks    Status  Partially Met      PT LONG TERM GOAL #6   Title  Pt will demo increased R LE strength 4-/5 to 4+/5 and show no lumbopelvic perturbaion with deep core level 2-3 5 reps in order to lift 2 year  old child  and bath him in tub  without pain1    Time  12    Period  Weeks    Status  Achieved      PT LONG TERM GOAL #7   Title  Pt will increase her cervical ROM  with sidebend L and rotation L by 5 deg       Baseline   cerivcal sidebend 45 deg, R 50 deg, L rotation 55 deg, R 60 deg  ( post Tx: 8/21: 55 deg sidebend B, rotation 60 deg B) .  11/13: cervical sidebend B 50 deg, rotation 65 deg B     Time  12    Period  Weeks    Status  Achieved      PT LONG TERM GOAL #8   Title  Pt will demo no radiating pain when holding her arm up in horizontal adduction, hand at sternum for 6 sec in order to lift and perform ADLs     Time  12    Period  Weeks    Status  Achieved            Plan - 05/05/17 1415    Clinical Impression Statement  Pt has achieved 7/8 goals and is progressing well towards her remaining goal. Pt has improved significantly in both body regions of pelvic floor and shoulder and has returned to functional activities such as having sexual intercourse and lifting without numbness in her R arm/hand.  Anticipate pt will be able to achieve remaining goal which is to have a bowel movements without splinting.  Addressed remaining pelvic floor dysfunction which includes mm tensions located in ischioanal fossa region and L puborectalis/ transverse perineal mm today. Advanced her hip strengthening exercises today.  Pt tolerated without complaints and demo'd less mm restrictions.  Pt continues to benefit from skilled PT for 1-2 more visits before d/c.   Rehab Potential  Good  PT Frequency  1x / week    PT Duration  12 weeks    PT Treatment/Interventions  Aquatic Therapy;Moist Heat;Traction;Stair training;Functional mobility training;Therapeutic activities;Therapeutic exercise;Balance training;Cryotherapy;Electrical Stimulation;Neuromuscular re-education;Manual techniques;Patient/family education;Manual lymph drainage;Compression bandaging;Scar mobilization;Energy conservation;Passive  range of motion;Taping    Consulted and Agree with Plan of Care  Patient       Patient will benefit from skilled therapeutic intervention in order to improve the following deficits and impairments:  Pain, Improper body mechanics, Decreased scar mobility, Decreased coordination, Decreased mobility, Increased muscle spasms, Postural dysfunction, Decreased strength, Decreased range of motion, Decreased endurance, Decreased activity tolerance, Decreased balance, Decreased safety awareness, Hypomobility  Visit Diagnosis: Muscle weakness (generalized)  Sacrococcygeal disorders, not elsewhere classified  Chronic right shoulder pain  Other symptoms and signs involving the musculoskeletal system     Problem List Patient Active Problem List   Diagnosis Date Noted  . Labor and delivery indication for care or intervention 10/06/2014  . Seasonal and perennial allergic rhinitis 05/14/2013  . ADD (attention deficit disorder) 02/04/2013  . GAD (generalized anxiety disorder) 02/04/2013  . Osteopenia 02/04/2013  . 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 Pain in joint, ankle and foot 02/04/2013    Jerl Mina ,PT, DPT, E-RYT  05/05/2017, 2:23 PM  [place] MAIN [facility] SERVICES [address] [place], [place], [address] Phone: [phone]   Fax:  [phone]  Name: Tonya Peterson MRN: [id number] Date of Birth: [birth year]

## 2017-05-05 NOTE — Patient Instructions (Signed)
Red band at thighs Side step, mini squat and then pulling the band with palm faced up, keeping elbow by ribs   10 ft each side x 3    _____  Stretching with foot on chair or sidelying with ankle raised up, pillow between knees

## 2017-05-18 ENCOUNTER — Ambulatory Visit: Payer: BLUE CROSS/BLUE SHIELD | Admitting: Physical Therapy

## 2017-05-18 DIAGNOSIS — G8929 Other chronic pain: Secondary | ICD-10-CM

## 2017-05-18 DIAGNOSIS — R29898 Other symptoms and signs involving the musculoskeletal system: Secondary | ICD-10-CM

## 2017-05-18 DIAGNOSIS — M6281 Muscle weakness (generalized): Secondary | ICD-10-CM

## 2017-05-18 DIAGNOSIS — M25511 Pain in right shoulder: Secondary | ICD-10-CM

## 2017-05-18 DIAGNOSIS — M533 Sacrococcygeal disorders, not elsewhere classified: Secondary | ICD-10-CM

## 2017-05-20 NOTE — Patient Instructions (Addendum)
Abdominal massage upward from L,  R , center to belly button 3 stroke x 3 , pressure is gentle and light with all fingers flat, not using finger tips    Catch yourself to uncross legs. If feeling cold, avoid crossing leg, instead dress warmer   to maintain body heat in order to decrease abdominal / pelvic tensions and help improve urinary urgency

## 2017-05-20 NOTE — Therapy (Signed)
[place] MAIN [facility] SERVICES [address] [place], [place], [address] Phone: [phone]   Fax:  [phone]  Physical Therapy Treatment  Patient Details  Name: Tonya Peterson MRN: [id number] Date of Birth: 1985-09-26 Referring Provider: Dr. Bobette Mo   Encounter Date: 05/18/2017  PT End of Session - 05/20/17 0003    Visit Number  12    Date for PT Re-Evaluation  07/28/17    PT Start Time  1401    PT Stop Time  1500    PT Time Calculation (min)  59 min    Activity Tolerance  Patient tolerated treatment well;No increased pain    Behavior During Therapy  WFL for tasks assessed/performed       Past Medical History:  Diagnosis Date  . ADD (attention deficit disorder)   . ADHD (attention deficit hyperactivity disorder) 05/2011  . Allergy   . Chronic kidney disease    left cyst, one kidney stone  . Depression   . Frequent headaches   . Skin abnormalities    precancerous moles    Past Surgical History:  Procedure Laterality Date  . gum graft    . ingrown toe nail Right    great toe  . NO PAST SURGERIES      There were no vitals filed for this visit.  Subjective Assessment - 05/19/17 2359    Subjective  Pt reports she has not had to use a ball of tissue to push against the perineum with bowel movements across 50% of her bowel movements since last session.  Pt has noticed 5% of the time, she has been to having urinary leakage and not able to make it to the bathroom           [facility] PT Assessment - 05/19/17 2359      Coordination   Gross Motor Movements are Fluid and Coordinated  -- restricted abdominal expansion               Pelvic Floor Special Questions - 05/19/17 2359    Pelvic Floor Internal Exam  pt consented verbally without contraindications    Exam Type  Vaginal    Palpation  increased puborectalis anterior R, increased perineal scare restriction 2-3 rd layers 7 o clock    Strength  fair squeeze,  definite lift                PT Education - 05/20/17 0000    Education provided  Yes    Education Details  HEP    Person(s) Educated  Patient    Methods  Explanation;Demonstration;Tactile cues;Verbal cues    Comprehension  Returned demonstration;Verbalized understanding          PT Long Term Goals - 05/05/17 1051      PT LONG TERM GOAL #1   Title  Pt will decrease her PFDI score from 62% to < 57% in order to improve pelvic floor function and return to ADLs (11/13: 48%)     Time  12    Period  Weeks    Status  Achieved      PT LONG TERM GOAL #2   Title  Pt will decrease her [place] score from 60% to < 40% in order to improve QOL   ( 8/21: 19%)     Time  12    Period  Weeks    Status  Achieved      PT LONG TERM GOAL #3   Title  Pt will  demo no pelvic obliquities and report no pain with trunk rotation B across 2 visits in order to progress to fitness exercises with less risk for injuries     Time  12    Period  Weeks    Status  Achieved      PT LONG TERM GOAL #4   Title  Pt will demo less fingers width at linea alba from 3 fingers to < 1 fingers in order to demo increased intraabdominal pressure system for minimizing LBP and incontinence.     Time  12    Period  Weeks    Status  Achieved      PT LONG TERM GOAL #5   Title  Pt will demo proper pelvic floor lengthening and contraction and to report no need for splinting to complete bowel movements 100% of the time across 1 week  in order to restore function of pelvic floor function    Time  12    Period  Weeks    Status  Partially Met      PT LONG TERM GOAL #6   Title  Pt will demo increased R LE strength 4-/5 to 4+/5 and show no lumbopelvic perturbaion with deep core level 2-3 5 reps in order to lift 48 year old child  and bath him in tub  without pain1    Time  12    Period  Weeks    Status  Achieved      PT LONG TERM GOAL #7   Title  Pt will increase her cervical ROM  with sidebend L and rotation L by 5 deg        Baseline   cerivcal sidebend 45 deg, R 50 deg, L rotation 55 deg, R 60 deg  ( post Tx: 8/21: 55 deg sidebend B, rotation 60 deg B) .  11/13: cervical sidebend B 50 deg, rotation 65 deg B     Time  12    Period  Weeks    Status  Achieved      PT LONG TERM GOAL #8   Title  Pt will demo no radiating pain when holding her arm up in horizontal adduction, hand at sternum for 6 sec in order to lift and perform ADLs     Time  12    Period  Weeks    Status  Achieved            Plan - 05/20/17 0008    Clinical Impression Statement  Pt is making 50% improvement with bowel elimination without splinting after last Tx. Pt will continue to benefit from further scar restriction releases.  Today, addressed scar restrictions through intravaginal manual Tx at deeper mm layers ilio coccygeus R.  Pt demo'd tensions at anterior mm with slightly lowered urethral position in combination with limited diaphragmatic excursion due to abdominal tensions. Suspect urinary urgency complaints are likely due to overactivity of pelvic floor and abdominal mm. Pt reported crossing her legs when she is feeling cold. Pt was advised to not cross her legs and to dress with warmer clothing in order to allow for diaphragmatic / abdominal expansion for optimal pelvic floor length. Pt continues to benefit from skilled PT.    Rehab Potential  Good    PT Frequency  1x / week    PT Duration  12 weeks    PT Treatment/Interventions  Aquatic Therapy;Moist Heat;Traction;Stair training;Functional mobility training;Therapeutic activities;Therapeutic exercise;Balance training;Cryotherapy;Electrical Stimulation;Neuromuscular re-education;Manual techniques;Patient/family education;Manual lymph drainage;Compression bandaging;Scar mobilization;Energy conservation;Passive range of  motion;Taping    Consulted and Agree with Plan of Care  Patient       Patient will benefit from skilled therapeutic intervention in order to improve the following  deficits and impairments:  Pain, Improper body mechanics, Decreased scar mobility, Decreased coordination, Decreased mobility, Increased muscle spasms, Postural dysfunction, Decreased strength, Decreased range of motion, Decreased endurance, Decreased activity tolerance, Decreased balance, Decreased safety awareness, Hypomobility  Visit Diagnosis: Muscle weakness (generalized)  Sacrococcygeal disorders, not elsewhere classified  Chronic right shoulder pain  Other symptoms and signs involving the musculoskeletal system     Problem List Patient Active Problem List   Diagnosis Date Noted  . Labor and delivery indication for care or intervention 10/06/2014  . Seasonal and perennial allergic rhinitis 05/14/2013  . ADD (attention deficit disorder) 02/04/2013  . GAD (generalized anxiety disorder) 02/04/2013  . Osteopenia 02/04/2013  . 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 Pain in joint, ankle and foot 02/04/2013    Jerl Mina ,PT, DPT, E-RYT  05/20/2017, 12:10 AM  [place] MAIN [facility] SERVICES [place], [place], [address] Phone: [phone]   Fax:  [phone]  Name: Tonya Peterson MRN: [id number] Date of Birth: [birth year]

## 2017-05-22 ENCOUNTER — Ambulatory Visit: Payer: BLUE CROSS/BLUE SHIELD | Admitting: Physical Therapy

## 2017-05-22 DIAGNOSIS — M6281 Muscle weakness (generalized): Secondary | ICD-10-CM | POA: Diagnosis not present

## 2017-05-22 DIAGNOSIS — M25511 Pain in right shoulder: Secondary | ICD-10-CM

## 2017-05-22 DIAGNOSIS — R29898 Other symptoms and signs involving the musculoskeletal system: Secondary | ICD-10-CM

## 2017-05-22 DIAGNOSIS — M533 Sacrococcygeal disorders, not elsewhere classified: Secondary | ICD-10-CM

## 2017-05-22 DIAGNOSIS — G8929 Other chronic pain: Secondary | ICD-10-CM

## 2017-05-22 NOTE — Therapy (Addendum)
[facility] [address] [place], [place], [address] Phone: [phone]   Fax:  [phone]  Physical Therapy Treatment  Patient Details  Name: Tonya Peterson MRN: [id number] Date of Birth: 02/21/1986 Referring Provider: Dr. Santayana   Encounter Date: 05/22/2017  PT End of Session - 05/22/17 1126    Visit Number  13    Date for PT Re-Evaluation 1/1/9   PT Start Time  0815    PT Stop Time  0900    PT Time Calculation (min)  45 min    Activity Tolerance  Patient tolerated treatment well;No increased pain    Behavior During Therapy  WFL for tasks assessed/performed       Past Medical History:  Diagnosis Date  . ADD (attention deficit disorder)   . ADHD (attention deficit hyperactivity disorder) 05/2011  . Allergy   . Chronic kidney disease    left cyst, one kidney stone  . Depression   . Frequent headaches   . Skin abnormalities    precancerous moles    Past Surgical History:  Procedure Laterality Date  . gum graft    . ingrown toe nail Right    great toe  . NO PAST SURGERIES      There were no vitals filed for this visit.  Subjective Assessment - 05/22/17 0823    Subjective  Pt reports her urinary urgency got better since last session.  Pt  is not having push with ball of toilet paper at the perineum with as much pressure.                    Pelvic Floor Special Questions - 05/22/17 1124    External Palpation  tightness at ischiotuerosity     Pelvic Floor Internal Exam  pt consented verbally without contraindications    Exam Type  Rectal    Palpation  increased tensions/ tenderness puborectalis posterior 7 clock,     Strength  fair squeeze, definite lift        [facility] Adult PT Treatment/Exercise - 05/22/17 1125      Manual Therapy   Internal Pelvic Floor  intrarectal  7 o clock , externally at L ischiotuberosity                  PT Long Term Goals - 05/05/17 1051       PT LONG TERM GOAL #1   Title  Pt will decrease her PFDI score from 62% to < 57% in order to improve pelvic floor function and return to ADLs (11/13: 48%)     Time  12    Period  Weeks    Status  Achieved      PT LONG TERM GOAL #2   Title  Pt will decrease her PDI score from 60% to < 40% in order to improve QOL   ( 8/21: 19%)     Time  12    Period  Weeks    Status  Achieved      PT LONG TERM GOAL #3   Title  Pt will demo no pelvic obliquities and report no pain with trunk rotation B across 2 visits in order to progress to fitness exercises with less risk for injuries     Time  12    Period  Weeks    Status  Achieved      PT LONG TERM GOAL #4   Title  Pt will demo less fingers   width at linea alba from 3 fingers to < 1 fingers in order to demo increased intraabdominal pressure system for minimizing LBP and incontinence.     Time  12    Period  Weeks    Status  Achieved      PT LONG TERM GOAL #5   Title  Pt will demo proper pelvic floor lengthening and contraction and to report no need for splinting to complete bowel movements 100% of the time across 1 week  in order to restore function of pelvic floor function    Time  12    Period  Weeks    Status  Partially Met      PT LONG TERM GOAL #6   Title  Pt will demo increased R LE strength 4-/5 to 4+/5 and show no lumbopelvic perturbaion with deep core level 2-3 5 reps in order to lift 4 year old child  and bath him in tub  without pain1    Time  12    Period  Weeks    Status  Achieved      PT LONG TERM GOAL #7   Title  Pt will increase her cervical ROM  with sidebend L and rotation L by 5 deg       Baseline   cerivcal sidebend 45 deg, R 50 deg, L rotation 55 deg, R 60 deg  ( post Tx: 8/21: 55 deg sidebend B, rotation 60 deg B) .  11/13: cervical sidebend B 50 deg, rotation 65 deg B     Time  12    Period  Weeks    Status  Achieved      PT LONG TERM GOAL #8   Title  Pt will demo no radiating pain when holding her arm up in  horizontal adduction, hand at sternum for 6 sec in order to lift and perform ADLs     Time  12    Period  Weeks    Status  Achieved            Plan - 05/22/17 1126    Clinical Impression Statement   Pt is close to achieving her remaining goals but will benefit from a frequency of 2x week before d/c. Pt's pelvic pain have significantly improved.  Pt has remaining issues which needs to be addressed to help her to not splint with bowel movements. Addressed tightness at L puborectalis through intrarectal manual Tx and externally at L ischial tuberosity today.  Pt reported feeling the area at the perineum and R hip being looser post Tx.    Rehab Potential  Good    PT Frequency  2x / week    PT Duration  2 weeks    PT Treatment/Interventions  Aquatic Therapy;Moist Heat;Traction;Stair training;Functional mobility training;Therapeutic activities;Therapeutic exercise;Balance training;Cryotherapy;Electrical Stimulation;Neuromuscular re-education;Manual techniques;Patient/family education;Manual lymph drainage;Compression bandaging;Scar mobilization;Energy conservation;Passive range of motion;Taping    Consulted and Agree with Plan of Care  Patient       Patient will benefit from skilled therapeutic intervention in order to improve the following deficits and impairments:  Pain, Improper body mechanics, Decreased scar mobility, Decreased coordination, Decreased mobility, Increased muscle spasms, Postural dysfunction, Decreased strength, Decreased range of motion, Decreased endurance, Decreased activity tolerance, Decreased balance, Decreased safety awareness, Hypomobility  Visit Diagnosis: Muscle weakness (generalized)  Sacrococcygeal disorders, not elsewhere classified  Chronic right shoulder pain  Other symptoms and signs involving the musculoskeletal system     Problem List Patient Active Problem List   Diagnosis Date   Noted  . Labor and delivery indication for care or intervention  10/06/2014  . Seasonal and perennial allergic rhinitis 05/14/2013  . ADD (attention deficit disorder) 02/04/2013  . GAD (generalized anxiety disorder) 02/04/2013  . Osteopenia 02/04/2013  . 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 Pain in joint, ankle and foot 02/04/2013    Yeung,Shin Yiing  05/22/2017, 11:30 AM  [facility] [address] [place], [place], [address] Phone: [phone]   Fax:  [phone]  Name: Sya Bullis Adrian MRN: [id number] Date of Birth: 06/26/1985   

## 2017-05-24 ENCOUNTER — Ambulatory Visit: Payer: BLUE CROSS/BLUE SHIELD | Admitting: Physical Therapy

## 2017-05-24 DIAGNOSIS — M533 Sacrococcygeal disorders, not elsewhere classified: Secondary | ICD-10-CM

## 2017-05-24 DIAGNOSIS — M25511 Pain in right shoulder: Secondary | ICD-10-CM

## 2017-05-24 DIAGNOSIS — M6281 Muscle weakness (generalized): Secondary | ICD-10-CM

## 2017-05-24 DIAGNOSIS — R29898 Other symptoms and signs involving the musculoskeletal system: Secondary | ICD-10-CM

## 2017-05-24 DIAGNOSIS — G8929 Other chronic pain: Secondary | ICD-10-CM

## 2017-05-24 NOTE — Addendum Note (Signed)
Addended by: Mariane MastersYEUNG, SHIN-YIING on: 05/24/2017 11:22 AM   Modules accepted: Orders

## 2017-05-24 NOTE — Patient Instructions (Addendum)
To continue stretching pelvic floor:   resting childs pose with pillows under belly and knees 10 min-15 min  toes straight and resting     Downward facing dog to wide squat as a way to get up from the floor    _____ To make tucking toes under more comfortable in downward facing dog   Heel raises 10 reps each leg/ 3 sets   Walking more with knee swings, push off with ballmounds and center of mass over shins and co-activation of deep core  ____

## 2017-05-24 NOTE — Therapy (Signed)
[facility] [address] [place], [place], [address] Phone: [phone]   Fax:  [phone]  Physical Therapy Treatment/ Discharge Summary   Patient Details  Name: Tonya Peterson [place] MRN: [id number] Date of Birth: 09/20/1985 Referring Provider: Dr. Ludwig ClarksSantayana   Encounter Date: 05/24/2017  PT End of Session - 05/24/17 1228    Visit Number  14    Date for PT Re-Evaluation  07/28/17    PT Start Time  1124    PT Stop Time  1229    PT Time Calculation (min)  65 min    Activity Tolerance  Patient tolerated treatment well;No increased pain    Behavior During Therapy  WFL for tasks assessed/performed       Past Medical History:  Diagnosis Date  . ADD (attention deficit disorder)   . ADHD (attention deficit hyperactivity disorder) 05/2011  . Allergy   . Chronic kidney disease    left cyst, one kidney stone  . Depression   . Frequent headaches   . Skin abnormalities    precancerous moles    Past Surgical History:  Procedure Laterality Date  . gum graft    . ingrown toe nail Right    great toe  . NO PAST SURGERIES      There were no vitals filed for this visit.  Subjective Assessment - 05/24/17 1133    Subjective  Pt reported she only had to splint with bowel movements 10% of the time instead of 50% after last session         [facility] - 05/24/17 1232      Ambulation/Gait   Gait Comments  minimal push off, knee swing, limited stride length                Pelvic Floor Special Questions - 05/24/17 1236    External Palpation  no tightness at ischial rami/tuberosity     Pelvic Floor Internal Exam  pt consented verbally without contraindications    Exam Type  Rectal    Palpation  increased tensions/ tenderness puborectalis posterior 5 clock, over ishcial spine  R sidelying     Strength  fair squeeze, definite lift        [facility] Adult PT Treatment/Exercise - 05/24/17 1232      Neuro  Re-ed    Neuro Re-ed Details   see pt instructions             PT Education - 05/24/17 1228    Education provided  Yes    Education Details  HEP, d/c     Person(s) Educated  Patient    Methods  Explanation;Demonstration;Tactile cues;Verbal cues;Handout    Comprehension  Returned demonstration;Verbalized understanding          PT Long Term Goals - 05/24/17 1228      PT LONG TERM GOAL #1   Title  Pt will decrease her PFDI score from 62% to < 57% in order to improve pelvic floor function and return to ADLs (11/13: 48%)     Time  12    Period  Weeks    Status  Achieved      PT LONG TERM GOAL #2   Title  Pt will decrease her PDI score from 60% to < 40% in order to improve QOL   ( 8/21: 19%)     Time  12    Period  Weeks    Status  Achieved  PT LONG TERM GOAL #3   Title  Pt will demo no pelvic obliquities and report no pain with trunk rotation B across 2 visits in order to progress to fitness exercises with less risk for injuries     Time  12    Period  Weeks    Status  Achieved      PT LONG TERM GOAL #4   Title  Pt will demo less fingers width at linea alba from 3 fingers to < 1 fingers in order to demo increased intraabdominal pressure system for minimizing LBP and incontinence.     Time  12    Period  Weeks    Status  Achieved      PT LONG TERM GOAL #5   Title  Pt will demo proper pelvic floor lengthening and contraction and to report no need for splinting to complete bowel movements 100% of the time across 1 week  in order to restore function of pelvic floor function    Time  12    Period  Weeks    Status  Achieved      PT LONG TERM GOAL #6   Title  Pt will demo increased R LE strength 4-/5 to 4+/5 and show no lumbopelvic perturbaion with deep core level 2-3 5 reps in order to lift 35 year old child  and bath him in tub  without pain1    Time  12    Period  Weeks    Status  Achieved      PT LONG TERM GOAL #7   Title  Pt will increase her cervical ROM   with sidebend L and rotation L by 5 deg       Baseline   cerivcal sidebend 45 deg, R 50 deg, L rotation 55 deg, R 60 deg  ( post Tx: 8/21: 55 deg sidebend B, rotation 60 deg B) .  11/13: cervical sidebend B 50 deg, rotation 65 deg B     Time  12    Period  Weeks    Status  Achieved      PT LONG TERM GOAL #8   Title  Pt will demo no radiating pain when holding her arm up in horizontal adduction, hand at sternum for 6 sec in order to lift and perform ADLs     Time  12    Period  Weeks    Status  Achieved            Plan - 05/24/17 1236    Clinical Impression Statement  Pt has achieved 100% of her goals across 14 visits. Pt's pelvic pain and shoulder pain have resolved. Pt's urinary leakage and R hip pain have resolved as indicated by improved questionnaire scores. Pt has returned to functional activities. Pt 's perineal scar restrictions has been decreased which has helped her to eliminate bowels completely with less need to splint/ push with her hand at the perineum. Pt showed improved pelvic floor coordination/ strength and co-activation of deep core muscles in gait, fitness exercises, and lifting child. Pt's diastasis recti and pelvic girdle obliquities have also resolved which has enhanced her intraabdominal pressure system related to post-partum changes. Pt has also increased in her R LE strength. Pt reports her Sx have improved "Very Great Deal Better" based on the GROC scale.  Pt is ready for d/c at this time.  Thank you for your referral!     Rehab Potential  Good    PT Frequency  1x / week    PT Duration  12 weeks    PT Treatment/Interventions  Aquatic Therapy;Moist Heat;Traction;Stair training;Functional mobility training;Therapeutic activities;Therapeutic exercise;Balance training;Cryotherapy;Electrical Stimulation;Neuromuscular re-education;Manual techniques;Patient/family education;Manual lymph drainage;Compression bandaging;Scar mobilization;Energy conservation;Passive range of  motion;Taping    Consulted and Agree with Plan of Care  Patient       Patient will benefit from skilled therapeutic intervention in order to improve the following deficits and impairments:  Pain, Improper body mechanics, Decreased scar mobility, Decreased coordination, Decreased mobility, Increased muscle spasms, Postural dysfunction, Decreased strength, Decreased range of motion, Decreased endurance, Decreased activity tolerance, Decreased balance, Decreased safety awareness, Hypomobility  Visit Diagnosis: Muscle weakness (generalized)  Sacrococcygeal disorders, not elsewhere classified  Chronic right shoulder pain  Other symptoms and signs involving the musculoskeletal system     Problem List Patient Active Problem List   Diagnosis Date Noted  . Labor and delivery indication for care or intervention 10/06/2014  . Seasonal and perennial allergic rhinitis 05/14/2013  . ADD (attention deficit disorder) 02/04/2013  . GAD (generalized anxiety disorder) 02/04/2013  . Osteopenia 02/04/2013  . 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 Pain in joint, ankle and foot 02/04/2013    Mariane MastersYeung,Shin Yiing ,PT, DPT, E-RYT  05/24/2017, 12:56 PM  [facility] [address] [place], [place], [address] Phone: [phone]   Fax:  [phone]  Name: Tonya Peterson [place] MRN: [id number] Date of Birth: 1985/10/13

## 2017-05-31 ENCOUNTER — Ambulatory Visit: Payer: BLUE CROSS/BLUE SHIELD | Admitting: Physical Therapy

## 2020-09-22 ENCOUNTER — Other Ambulatory Visit: Payer: Self-pay

## 2020-09-22 ENCOUNTER — Other Ambulatory Visit (HOSPITAL_COMMUNITY)
Admission: RE | Admit: 2020-09-22 | Discharge: 2020-09-22 | Disposition: A | Discharge status: Home / Self Care | Payer: BLUE CROSS/BLUE SHIELD | Source: Ambulatory Visit | Attending: Advanced Practice Midwife | Admitting: Advanced Practice Midwife

## 2020-09-22 ENCOUNTER — Ambulatory Visit (INDEPENDENT_AMBULATORY_CARE_PROVIDER_SITE_OTHER): Payer: BC Managed Care – PPO | Admitting: Advanced Practice Midwife

## 2020-09-22 ENCOUNTER — Encounter: Payer: Self-pay | Admitting: Advanced Practice Midwife

## 2020-09-22 VITALS — BP 110/66 | Ht 65.34 in | Wt 120.0 lb

## 2020-09-22 DIAGNOSIS — Z124 Encounter for screening for malignant neoplasm of cervix: Secondary | ICD-10-CM | POA: Insufficient documentation

## 2020-09-22 DIAGNOSIS — K5909 Other constipation: Secondary | ICD-10-CM | POA: Diagnosis not present

## 2020-09-22 DIAGNOSIS — Z30432 Encounter for removal of intrauterine contraceptive device: Secondary | ICD-10-CM | POA: Diagnosis not present

## 2020-09-22 DIAGNOSIS — Z01419 Encounter for gynecological examination (general) (routine) without abnormal findings: Secondary | ICD-10-CM | POA: Insufficient documentation

## 2020-09-22 NOTE — Patient Instructions (Addendum)
Health Maintenance, Female Adopting a healthy lifestyle and getting preventive care are important in promoting health and wellness. Ask your health care provider about:  The right schedule for you to have regular tests and exams.  Things you can do on your own to prevent diseases and keep yourself healthy. What should I know about diet, weight, and exercise? Eat a healthy diet  Eat a diet that includes plenty of vegetables, fruits, low-fat dairy products, and lean protein.  Do not eat a lot of foods that are high in solid fats, added sugars, or sodium.   Maintain a healthy weight Body mass index (BMI) is used to identify weight problems. It estimates body fat based on height and weight. Your health care provider can help determine your BMI and help you achieve or maintain a healthy weight. Get regular exercise Get regular exercise. This is one of the most important things you can do for your health. Most adults should:  Exercise for at least 150 minutes each week. The exercise should increase your heart rate and make you sweat (moderate-intensity exercise).  Do strengthening exercises at least twice a week. This is in addition to the moderate-intensity exercise.  Spend less time sitting. Even light physical activity can be beneficial. Watch cholesterol and blood lipids Have your blood tested for lipids and cholesterol at 33 years of age, then have this test every 5 years. Have your cholesterol levels checked more often if:  Your lipid or cholesterol levels are high.  You are older than 33 years of age.  You are at high risk for heart disease. What should I know about cancer screening? Depending on your health history and family history, you may need to have cancer screening at various ages. This may include screening for:  Breast cancer.  Cervical cancer.  Colorectal cancer.  Skin cancer.  Lung cancer. What should I know about heart disease, diabetes, and high blood  pressure? Blood pressure and heart disease  High blood pressure causes heart disease and increases the risk of stroke. This is more likely to develop in people who have high blood pressure readings, are of African descent, or are overweight.  Have your blood pressure checked: ? Every 3-5 years if you are 18-39 years of age. ? Every year if you are 40 years old or older. Diabetes Have regular diabetes screenings. This checks your fasting blood sugar level. Have the screening done:  Once every three years after age 40 if you are at a normal weight and have a low risk for diabetes.  More often and at a younger age if you are overweight or have a high risk for diabetes. What should I know about preventing infection? Hepatitis B If you have a higher risk for hepatitis B, you should be screened for this virus. Talk with your health care provider to find out if you are at risk for hepatitis B infection. Hepatitis C Testing is recommended for:  Everyone born from 1945 through 1965.  Anyone with known risk factors for hepatitis C. Sexually transmitted infections (STIs)  Get screened for STIs, including gonorrhea and chlamydia, if: ? You are sexually active and are younger than 33 years of age. ? You are older than 33 years of age and your health care provider tells you that you are at risk for this type of infection. ? Your sexual activity has changed since you were last screened, and you are at increased risk for chlamydia or gonorrhea. Ask your health care provider   if you are at risk.  Ask your health care provider about whether you are at high risk for HIV. Your health care provider may recommend a prescription medicine to help prevent HIV infection. If you choose to take medicine to prevent HIV, you should first get tested for HIV. You should then be tested every 3 months for as long as you are taking the medicine. Pregnancy  If you are about to stop having your period (premenopausal) and  you may become pregnant, seek counseling before you get pregnant.  Take 400 to 800 micrograms (mcg) of folic acid every day if you become pregnant.  Ask for birth control (contraception) if you want to prevent pregnancy. Osteoporosis and menopause Osteoporosis is a disease in which the bones lose minerals and strength with aging. This can result in bone fractures. If you are 63 years old or older, or if you are at risk for osteoporosis and fractures, ask your health care provider if you should:  Be screened for bone loss.  Take a calcium or vitamin D supplement to lower your risk of fractures.  Be given hormone replacement therapy (HRT) to treat symptoms of menopause. Follow these instructions at home: Lifestyle  Do not use any products that contain nicotine or tobacco, such as cigarettes, e-cigarettes, and chewing tobacco. If you need help quitting, ask your health care provider.  Do not use street drugs.  Do not share needles.  Ask your health care provider for help if you need support or information about quitting drugs. Alcohol use  Do not drink alcohol if: ? Your health care provider tells you not to drink. ? You are pregnant, may be pregnant, or are planning to become pregnant.  If you drink alcohol: ? Limit how much you use to 0-1 drink a day. ? Limit intake if you are breastfeeding.  Be aware of how much alcohol is in your drink. In the [place] one drink equals one 12 oz bottle of beer (355 mL), one 5 oz glass of wine (148 mL), or one 1 oz glass of hard liquor (44 mL). General instructions  Schedule regular health, dental, and eye exams.  Stay current with your vaccines.  Tell your health care provider if: ? You often feel depressed. ? You have ever been abused or do not feel safe at home. Summary  Adopting a healthy lifestyle and getting preventive care are important in promoting health and wellness.  Follow your health care provider's instructions about healthy  diet, exercising, and getting tested or screened for diseases.  Follow your health care provider's instructions on monitoring your cholesterol and blood pressure. This information is not intended to replace advice given to you by your health care provider. Make sure you discuss any questions you have with your health care provider. Document Revised: 05/09/2018 Document Reviewed: 05/09/2018 Elsevier Patient Education  2021 [employer]  Osteopenia  Osteopenia is a loss of thickness (density) inside the bones. Another name for osteopenia is low bone mass. Mild osteopenia is a normal part of aging. It is not a disease, and it does not cause symptoms. However, if you have osteopenia and continue to lose bone mass, you could develop a condition that causes the bones to become thin and break more easily (osteoporosis). Osteoporosis can cause you to lose some height, have back pain, and have a stooped posture. Although osteopenia is not a disease, making changes to your lifestyle and diet can help to prevent osteopenia from developing into osteoporosis. What are  the causes? Osteopenia is caused by loss of calcium in the bones. Bones are constantly changing. Old bone cells are continually being replaced with new bone cells. This process builds new bone. The mineral calcium is needed to build new bone and maintain bone density. Bone density is usually highest around age 41. After that, most people's bodies cannot replace all the bone they have lost with new bone. What increases the risk? You are more likely to develop this condition if:  You are older than age 58.  You are a woman who went through menopause early.  You have a long illness that keeps you in bed.  You do not get enough exercise.  You lack certain nutrients (malnutrition).  You have an overactive thyroid gland (hyperthyroidism).  You use products that contain nicotine or tobacco, such as cigarettes, e-cigarettes and chewing  tobacco, or you drink a lot of alcohol.  You are taking medicines that weaken the bones, such as steroids. What are the signs or symptoms? This condition does not cause any symptoms. You may have a slightly higher risk for bone breaks (fractures), so getting fractures more easily than normal may be an indication of osteopenia. How is this diagnosed? This condition may be diagnosed based on an X-ray exam that measures bone density (dual-energy X-ray absorptiometry, or DEXA). This test can measure bone density in your hips, spine, and wrists. Osteopenia has no symptoms, so this condition is usually diagnosed after a routine bone density screening test is done for osteoporosis. This routine screening is usually done for:  Women who are age 34 or older.  Men who are age 48 or older. If you have risk factors for osteopenia, you may have the screening test at an earlier age. How is this treated? Making dietary and lifestyle changes can lower your risk for osteoporosis. If you have severe osteopenia that is close to becoming osteoporosis, this condition can be treated with medicines and dietary supplements such as calcium and vitamin D. These supplements help to rebuild bone density. Follow these instructions at home: Eating and drinking Eat a diet that is high in calcium and vitamin D.  Calcium is found in dairy products, beans, salmon, and leafy green vegetables like spinach and broccoli.  Look for foods that have vitamin D and calcium added to them (fortified foods), such as orange juice, cereal, and bread.   Lifestyle  Do 30 minutes or more of a weight-bearing exercise every day, such as walking, jogging, or playing a sport. These types of exercises strengthen the bones.  Do not use any products that contain nicotine or tobacco, such as cigarettes, e-cigarettes, and chewing tobacco. If you need help quitting, ask your health care provider.  Do not drink alcohol if: ? Your health care  provider tells you not to drink. ? You are pregnant, may be pregnant, or are planning to become pregnant.  If you drink alcohol: ? Limit how much you use to:  0-1 drink a day for women.  0-2 drinks a day for men. ? Be aware of how much alcohol is in your drink. In the [place] one drink equals one 12 oz bottle of beer (355 mL), one 5 oz glass of wine (148 mL), or one 1 oz glass of hard liquor (44 mL). General instructions  Take over-the-counter and prescription medicines only as told by your health care provider. These include vitamins and supplements.  Take precautions at home to lower your risk of falling, such as: ? Keeping  rooms well-lit and free of clutter, such as cords. ? Installing safety rails on stairs. ? Using rubber mats in the bathroom or other areas that are often wet or slippery.  Keep all follow-up visits. This is important. Contact a health care provider if:  You have not had a bone density screening for osteoporosis and you are: ? A woman who is age 56 or older. ? A man who is age 84 or older.  You are a postmenopausal woman who has not had a bone density screening for osteoporosis.  You are older than age 65 and you want to know if you should have bone density screening for osteoporosis. Summary  Osteopenia is a loss of thickness (density) inside the bones. Another name for osteopenia is low bone mass.  Osteopenia is not a disease, but it may increase your risk for a condition that causes the bones to become thin and break more easily (osteoporosis).  You may be at risk for osteopenia if you are older than age 73 or if you are a woman who went through early menopause.  Osteopenia does not cause any symptoms, but it can be diagnosed with a bone density screening test.  Dietary and lifestyle changes are the first treatment for osteopenia. These may lower your risk for osteoporosis. This information is not intended to replace advice given to you by your health  care provider. Make sure you discuss any questions you have with your health care provider. Document Revised: 10/31/2019 Document Reviewed: 10/31/2019 Elsevier Patient Education  2021 [employer].

## 2020-09-23 NOTE — Progress Notes (Signed)
Gynecology Annual Exam   PCP: Sherlene Shams, MD  Chief Complaint:  Chief Complaint  Patient presents with  . Gynecologic Exam    Annual - IUD removal, no OCP at this time will use condoms.    History of Present Illness: Patient is a 35 y.o. G1P1001 presents for annual exam. The patient has no gyn complaints today. She mentions problems with passing stool since her delivery in 2016. She was seen for pelvic floor therapy for about 6 months in 2018 which helped the dyspareunia she was experiencing. She continues to have issues passing stool. She denies constipation or diarrhea. She thinks she may have a hemorrhoid. She may want a referral to gastroenterology and will let me know.   She requests removal of her IUD which was placed 6 years ago after delivery. She understands that use is approved through 7 years. She would like to have a hormone free interval. She started taking hormones as a teen due to heavy and prolonged periods. She developed osteopenia due to being on Depo for a number of years. That has since resolved.  LMP: No LMP recorded. (Menstrual status: IUD).  Postcoital Bleeding: no Dysmenorrhea: not applicable  The patient is sexually active. She currently uses IUD for contraception. She denies dyspareunia.  The patient does perform self breast exams.  There is no notable family history of breast or ovarian cancer in her family.  The patient wears seatbelts: yes.   The patient has regular exercise: she is active with her child, she admits a healthy diet, her preferred drink is Red Bull, she admits adequate sleep.    The patient denies current symptoms of depression.    Review of Systems: Review of Systems  Constitutional: Negative for chills and fever.  HENT: Positive for congestion. Negative for ear discharge, ear pain, hearing loss, sinus pain and sore throat.   Eyes: Negative for blurred vision and double vision.  Respiratory: Negative for cough, shortness of breath  and wheezing.   Cardiovascular: Negative for chest pain, palpitations and leg swelling.  Gastrointestinal: Negative for abdominal pain, blood in stool, constipation, diarrhea, heartburn, melena, nausea and vomiting.  Genitourinary: Negative for dysuria, flank pain, frequency, hematuria and urgency.  Musculoskeletal: Negative for back pain, joint pain and myalgias.       Positive for muscle weakness, tingling, numbness  Skin: Negative for itching and rash.  Neurological: Positive for dizziness. Negative for tingling, tremors, sensory change, speech change, focal weakness, seizures, loss of consciousness, weakness and headaches.  Endo/Heme/Allergies: Negative for environmental allergies. Does not bruise/bleed easily.  Psychiatric/Behavioral: Negative for depression, hallucinations, memory loss, substance abuse and suicidal ideas. The patient is not nervous/anxious and does not have insomnia.     Past Medical History:  Patient Active Problem List   Diagnosis Date Noted  . Pelvic floor dysfunction 10/11/2016  . Seasonal and perennial allergic rhinitis 05/14/2013  . ADD (attention deficit disorder) 02/04/2013  . GAD (generalized anxiety disorder) 02/04/2013  . Osteopenia 02/04/2013    Past Surgical History:  Past Surgical History:  Procedure Laterality Date  . gum graft    . ingrown toe nail Right    great toe  . NO PAST SURGERIES      Gynecologic History:  No LMP recorded. (Menstrual status: IUD). Contraception: IUD Last Pap: 2017 Results were: no abnormalities per patient report  Obstetric History: G1P1001  Family History:  Family History  Problem Relation Age of Onset  . Diabetes Mother   .  Hypertension Father   . Cancer Maternal Grandfather   . Diabetes Paternal Grandmother   . Cancer Paternal Grandfather     Social History:  Social History   Socioeconomic History  . Marital status: Married    Spouse name: Not on file  . Number of children: 0  . Years of  education: Not on file  . Highest education level: Not on file  Occupational History  . Not on file  Tobacco Use  . Smoking status: Former Smoker    Packs/day: 0.50    Years: 5.00    Pack years: 2.50    Types: Cigarettes    Start date: 02/05/2008    Quit date: 09/06/2014    Years since quitting: 6.0  . Smokeless tobacco: Never Used  Vaping Use  . Vaping Use: Every day  . Substances: Nicotine  Substance and Sexual Activity  . Alcohol use: No    Comment: rare/social  . Drug use: No  . Sexual activity: Yes    Birth control/protection: None  Other Topics Concern  . Not on file  Social History Narrative  . Not on file   Social Determinants of Health   Financial Resource Strain: Not on file  Food Insecurity: Not on file  Transportation Needs: Not on file  Physical Activity: Not on file  Stress: Not on file  Social Connections: Not on file  Intimate Partner Violence: Not on file    Allergies:  Allergies  Allergen Reactions  . Eggs Or Egg-Derived Products     Nasal congestion, face turns hot in addition to stomach pain     Medications: Prior to Admission medications   Medication Sig Start Date End Date Taking? Authorizing Provider  amphetamine-dextroamphetamine (ADDERALL) 30 MG tablet Take 1 tablet by mouth 2 (two) times daily. 09/03/20  Yes [provider]    Physical Exam Vitals: Blood pressure 110/66, height 5' 5.34" (1.66 m), weight 120 lb (54.4 kg)  General: NAD HEENT: normocephalic, anicteric Thyroid: no enlargement, no palpable nodules Pulmonary: No increased work of breathing, CTAB Cardiovascular: RRR, distal pulses 2+ Breast: Breast symmetrical, no tenderness, no palpable nodules or masses, no skin or nipple retraction present, no nipple discharge.  No axillary or supraclavicular lymphadenopathy. Abdomen: NABS, soft, non-tender, non-distended.  Umbilicus without lesions.  No hepatomegaly, splenomegaly or masses palpable. No evidence of hernia   Genitourinary:  External: Normal external female genitalia.  Normal urethral meatus, normal Bartholin's and Skene's glands.    Vagina: Normal vaginal mucosa, no evidence of prolapse.    Cervix: Grossly normal in appearance, no bleeding, no CMT  Uterus: Non-enlarged, mobile, normal contour.    Adnexa: ovaries non-enlarged, no adnexal masses  Rectal: no apparent physiologic cause for difficulty passing stool, normal muscle strength, very small external  hemorrhoidal tissue  Lymphatic: no evidence of inguinal lymphadenopathy Extremities: no edema, erythema, or tenderness Neurologic: Grossly intact Psychiatric: mood appropriate, affect full   Assessment: 35 y.o. G1P1001 routine annual exam  Plan: Problem List Items Addressed This Visit   None   Visit Diagnoses    Well woman exam with routine gynecological exam    -  Primary   Relevant Orders   Cytology - PAP   Screening for cervical cancer       Relevant Orders   Cytology - PAP   Encounter for IUD removal          1) STI screening  was offered and declined  2)  ASCCP guidelines and rationale discussed.  Patient opts  for every 5 years screening interval. PAP today  3) Contraception - the patient is currently using  IUD.  She is interested in changing to condoms  4) Routine healthcare maintenance including cholesterol, diabetes screening discussed Declines   5) Bowel dysfunction: Will send GI referral as patient requests   6) Return in about 1 year (around 09/22/2021) for annual established gyn.   Tresea Mall, CNM [facility] 09/23/2020, 3:15 PM

## 2020-09-23 NOTE — Progress Notes (Signed)
    GYNECOLOGY OFFICE PROCEDURE NOTE  Tonya Peterson is a 35 y.o. G1P1001 here for IUD removal. The patient currently has Mirena IUD placed in 2016.  No GYN concerns.  Last pap smear was in 2017 and was normal. PAP today.  IUD Removal  Patient identified, informed consent performed, consent signed.  Time out was performed. Speculum placed in the vagina. The strings of the IUD were grasped and pulled using ring forceps. The IUD was successfully removed in its entirety.  Patient tolerated procedure well.     Tresea Mall, CNM [facility]   IUD remval [id number] [address] 25

## 2020-09-24 LAB — CYTOLOGY - PAP
Comment: NEGATIVE
Diagnosis: NEGATIVE
High risk HPV: NEGATIVE

## 2021-06-07 DIAGNOSIS — F902 Attention-deficit hyperactivity disorder, combined type: Secondary | ICD-10-CM | POA: Diagnosis not present

## 2021-09-02 DIAGNOSIS — F902 Attention-deficit hyperactivity disorder, combined type: Secondary | ICD-10-CM | POA: Diagnosis not present

## 2021-12-02 DIAGNOSIS — F4323 Adjustment disorder with mixed anxiety and depressed mood: Secondary | ICD-10-CM | POA: Diagnosis not present

## 2021-12-02 DIAGNOSIS — F902 Attention-deficit hyperactivity disorder, combined type: Secondary | ICD-10-CM | POA: Diagnosis not present

## 2022-03-21 DIAGNOSIS — F902 Attention-deficit hyperactivity disorder, combined type: Secondary | ICD-10-CM | POA: Diagnosis not present

## 2022-03-21 DIAGNOSIS — F4323 Adjustment disorder with mixed anxiety and depressed mood: Secondary | ICD-10-CM | POA: Diagnosis not present

## 2022-07-06 DIAGNOSIS — F902 Attention-deficit hyperactivity disorder, combined type: Secondary | ICD-10-CM | POA: Diagnosis not present

## 2022-07-06 DIAGNOSIS — F4323 Adjustment disorder with mixed anxiety and depressed mood: Secondary | ICD-10-CM | POA: Diagnosis not present

## 2022-10-10 DIAGNOSIS — F902 Attention-deficit hyperactivity disorder, combined type: Secondary | ICD-10-CM | POA: Diagnosis not present

## 2022-10-10 DIAGNOSIS — F4323 Adjustment disorder with mixed anxiety and depressed mood: Secondary | ICD-10-CM | POA: Diagnosis not present

## 2023-01-02 DIAGNOSIS — F4323 Adjustment disorder with mixed anxiety and depressed mood: Secondary | ICD-10-CM | POA: Diagnosis not present

## 2023-01-02 DIAGNOSIS — F902 Attention-deficit hyperactivity disorder, combined type: Secondary | ICD-10-CM | POA: Diagnosis not present

## 2023-03-08 DIAGNOSIS — J029 Acute pharyngitis, unspecified: Secondary | ICD-10-CM | POA: Diagnosis not present

## 2023-03-10 DIAGNOSIS — R030 Elevated blood-pressure reading, without diagnosis of hypertension: Secondary | ICD-10-CM | POA: Diagnosis not present

## 2023-03-10 DIAGNOSIS — J039 Acute tonsillitis, unspecified: Secondary | ICD-10-CM | POA: Diagnosis not present

## 2023-03-10 DIAGNOSIS — R251 Tremor, unspecified: Secondary | ICD-10-CM | POA: Diagnosis not present

## 2023-03-10 DIAGNOSIS — R42 Dizziness and giddiness: Secondary | ICD-10-CM | POA: Diagnosis not present

## 2023-04-03 DIAGNOSIS — F902 Attention-deficit hyperactivity disorder, combined type: Secondary | ICD-10-CM | POA: Diagnosis not present

## 2023-04-03 DIAGNOSIS — F4323 Adjustment disorder with mixed anxiety and depressed mood: Secondary | ICD-10-CM | POA: Diagnosis not present

## 2023-04-24 ENCOUNTER — Other Ambulatory Visit (HOSPITAL_COMMUNITY)
Admission: RE | Admit: 2023-04-24 | Discharge: 2023-04-24 | Disposition: A | Discharge status: Home / Self Care | Payer: BC Managed Care – PPO | Source: Ambulatory Visit | Attending: Nurse Practitioner | Admitting: Nurse Practitioner

## 2023-04-24 ENCOUNTER — Encounter: Payer: Self-pay | Admitting: Nurse Practitioner

## 2023-04-24 ENCOUNTER — Other Ambulatory Visit: Payer: Self-pay | Admitting: Nurse Practitioner

## 2023-04-24 ENCOUNTER — Ambulatory Visit: Payer: BC Managed Care – PPO | Admitting: Nurse Practitioner

## 2023-04-24 VITALS — BP 136/84 | HR 92 | Temp 98.1°F | Ht 66.0 in | Wt 132.0 lb

## 2023-04-24 DIAGNOSIS — L729 Follicular cyst of the skin and subcutaneous tissue, unspecified: Secondary | ICD-10-CM | POA: Diagnosis not present

## 2023-04-24 DIAGNOSIS — F411 Generalized anxiety disorder: Secondary | ICD-10-CM

## 2023-04-24 DIAGNOSIS — B9689 Other specified bacterial agents as the cause of diseases classified elsewhere: Secondary | ICD-10-CM | POA: Diagnosis not present

## 2023-04-24 DIAGNOSIS — Z0001 Encounter for general adult medical examination with abnormal findings: Secondary | ICD-10-CM | POA: Diagnosis not present

## 2023-04-24 DIAGNOSIS — R8290 Unspecified abnormal findings in urine: Secondary | ICD-10-CM | POA: Insufficient documentation

## 2023-04-24 DIAGNOSIS — R718 Other abnormality of red blood cells: Secondary | ICD-10-CM

## 2023-04-24 DIAGNOSIS — N760 Acute vaginitis: Secondary | ICD-10-CM | POA: Diagnosis not present

## 2023-04-24 DIAGNOSIS — N898 Other specified noninflammatory disorders of vagina: Secondary | ICD-10-CM | POA: Insufficient documentation

## 2023-04-24 DIAGNOSIS — L989 Disorder of the skin and subcutaneous tissue, unspecified: Secondary | ICD-10-CM | POA: Insufficient documentation

## 2023-04-24 DIAGNOSIS — Z1329 Encounter for screening for other suspected endocrine disorder: Secondary | ICD-10-CM | POA: Diagnosis not present

## 2023-04-24 DIAGNOSIS — Z13220 Encounter for screening for lipoid disorders: Secondary | ICD-10-CM | POA: Diagnosis not present

## 2023-04-24 DIAGNOSIS — Z1159 Encounter for screening for other viral diseases: Secondary | ICD-10-CM

## 2023-04-24 LAB — POC URINALSYSI DIPSTICK (AUTOMATED)
Bilirubin, UA: NEGATIVE
Blood, UA: NEGATIVE
Glucose, UA: NEGATIVE
Leukocytes, UA: NEGATIVE
Nitrite, UA: NEGATIVE
Protein, UA: POSITIVE — AB
Spec Grav, UA: 1.025 (ref 1.010–1.025)
Urobilinogen, UA: 0.2 U/dL
pH, UA: 6 (ref 5.0–8.0)

## 2023-04-24 LAB — COMPREHENSIVE METABOLIC PANEL
ALT: 53 U/L — ABNORMAL HIGH (ref 0–35)
AST: 56 U/L — ABNORMAL HIGH (ref 0–37)
Albumin: 4.3 g/dL (ref 3.5–5.2)
Alkaline Phosphatase: 44 U/L (ref 39–117)
BUN: 12 mg/dL (ref 6–23)
CO2: 28 meq/L (ref 19–32)
Calcium: 9 mg/dL (ref 8.4–10.5)
Chloride: 101 meq/L (ref 96–112)
Creatinine, Ser: 0.76 mg/dL (ref 0.40–1.20)
GFR: 101.52 mL/min (ref >60.00)
Glucose, Bld: 86 mg/dL (ref 70–99)
Potassium: 3.9 meq/L (ref 3.5–5.1)
Sodium: 139 meq/L (ref 135–145)
Total Bilirubin: 0.7 mg/dL (ref 0.2–1.2)
Total Protein: 7.1 g/dL (ref 6.0–8.3)

## 2023-04-24 LAB — CBC WITH DIFFERENTIAL/PLATELET
Basophils Absolute: 0.1 10*3/uL (ref 0.0–0.1)
Basophils Relative: 1.9 % (ref 0.0–3.0)
Eosinophils Absolute: 0.1 10*3/uL (ref 0.0–0.7)
Eosinophils Relative: 1.5 % (ref 0.0–5.0)
HCT: 42.9 % (ref 36.0–46.0)
Hemoglobin: 14.2 g/dL (ref 12.0–15.0)
Lymphocytes Relative: 33.4 % (ref 12.0–46.0)
Lymphs Abs: 1.5 10*3/uL (ref 0.7–4.0)
MCHC: 33.1 g/dL (ref 30.0–36.0)
MCV: 104.7 fL — ABNORMAL HIGH (ref 78.0–100.0)
Monocytes Absolute: 0.5 10*3/uL (ref 0.1–1.0)
Monocytes Relative: 12.2 % — ABNORMAL HIGH (ref 3.0–12.0)
Neutro Abs: 2.3 10*3/uL (ref 1.4–7.7)
Neutrophils Relative %: 51 % (ref 43.0–77.0)
Platelets: 133 10*3/uL — ABNORMAL LOW (ref 150.0–400.0)
RBC: 4.1 Mil/uL (ref 3.87–5.11)
RDW: 12.5 % (ref 11.5–15.5)
WBC: 4.5 10*3/uL (ref 4.0–10.5)

## 2023-04-24 LAB — URINALYSIS, ROUTINE W REFLEX MICROSCOPIC
Bilirubin Urine: NEGATIVE
Hgb urine dipstick: NEGATIVE
Ketones, ur: NEGATIVE
Leukocytes,Ua: NEGATIVE
Nitrite: NEGATIVE
RBC / HPF: NONE SEEN (ref 0–?)
Specific Gravity, Urine: 1.02 (ref 1.000–1.030)
Total Protein, Urine: 30 — AB
Urine Glucose: NEGATIVE
Urobilinogen, UA: 0.2 (ref 0.0–1.0)
WBC, UA: NONE SEEN (ref 0–?)
pH: 6 (ref 5.0–8.0)

## 2023-04-24 LAB — TSH: TSH: 1.89 u[IU]/mL (ref 0.35–5.50)

## 2023-04-24 LAB — IBC + FERRITIN
Ferritin: 96.4 ng/mL (ref 10.0–291.0)
Iron: 203 ug/dL — ABNORMAL HIGH (ref 42–145)
Saturation Ratios: 50.5 % — ABNORMAL HIGH (ref 20.0–50.0)
TIBC: 401.8 ug/dL (ref 250.0–450.0)
Transferrin: 287 mg/dL (ref 212.0–360.0)

## 2023-04-24 LAB — VITAMIN D 25 HYDROXY (VIT D DEFICIENCY, FRACTURES): VITD: 29.79 ng/mL — ABNORMAL LOW (ref 30.00–100.00)

## 2023-04-24 LAB — LIPID PANEL
Cholesterol: 231 mg/dL — ABNORMAL HIGH (ref 0–200)
HDL: 127.9 mg/dL (ref >39.00)
LDL Cholesterol: 86 mg/dL (ref 0–99)
NonHDL: 102.64
Total CHOL/HDL Ratio: 2
Triglycerides: 83 mg/dL (ref 0.0–149.0)
VLDL: 16.6 mg/dL (ref 0.0–40.0)

## 2023-04-24 NOTE — Progress Notes (Signed)
Bethanie Dicker, NP-C Phone: [phone]  Tonya Peterson is a 37 y.o. female who presents today to establish care.   Discussed the use of AI scribe software for clinical note transcription with the patient, who gave verbal consent to proceed.  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 She is currently under psychiatric care and has recently started on a low dose of lorazepam for anxiety. The patient reports mixed results with the medication, noting it has not been taken daily and the effects are still uncertain.  The patient has a history of pelvic floor therapy and an IUD, which was removed in her last OB-GYN visit. Since the removal, the patient reports irregular, short menstrual cycles, which she attributes to stress and the absence of birth control.  The patient presents with a longstanding cyst-like lesion in the pelvic area, which has been present for approximately a year. The lesion is described as hard and occasionally painful, with no associated discharge or hair growth. Recently, the patient has noticed an unusual odor in the area, which she has never experienced before. She denies any significant discharge, urinary symptoms, or other genital abnormalities. The patient's sexual history is not suggestive of high-risk behavior for sexually transmitted diseases.  The patient also reports frequent nosebleeds in the past couple of years, which were previously not an issue. She describes these episodes as sudden and heavy but manageable within a few minutes. The patient also notes increased bruising and a recent episode of laryngitis treated with antibiotics.  The patient's lifestyle includes daily alcohol consumption, averaging three drinks per day, and vaping. She reports a generally balanced diet, mostly home-cooked meals, and an  active lifestyle due to her job and caring for her child and pet. The patient acknowledges sleep disturbances, which she attributes to hot flashes and anxiety attacks, possibly related to recent life changes.      Active Ambulatory Problems    Diagnosis Date Noted   ADD (attention deficit disorder) 02/04/2013   GAD (generalized anxiety disorder) 02/04/2013   Osteopenia 02/04/2013   Seasonal and perennial allergic rhinitis 05/14/2013   Pelvic floor dysfunction 10/11/2016   Encounter for routine adult medical exam with abnormal findings 04/24/2023   Vaginal odor 04/24/2023   Cyst of skin 04/24/2023   Resolved Ambulatory Problems    Diagnosis Date Noted   Diarrhea secondary to food allergy 02/04/2013   Pain in joint, ankle and foot 02/04/2013   Poison ivy dermatitis 02/15/2013   Labor and delivery indication for care or intervention 10/06/2014   Past Medical History:  Diagnosis Date   ADHD (attention deficit hyperactivity disorder) 05/2011   Allergy    Chronic kidney disease    Depression    Frequent headaches    Skin abnormalities     Family History  Problem Relation Age of Onset   Diabetes Mother    Hypertension Father    Cancer Maternal Grandfather    Diabetes Paternal Grandmother    Cancer Paternal Grandfather     Social History   Socioeconomic History   Marital status: Married    Spouse name: Not on file   Number of children: 0   Years of education: Not on file   Highest education level: Not on file  Occupational History   Not on file  Tobacco Use   Smoking status: Former  Current packs/day: 0.00    Average packs/day: 0.5 packs/day for 6.6 years (3.3 ttl pk-yrs)    Types: Cigarettes    Start date: 02/05/2008    Quit date: 09/06/2014    Years since quitting: 8.6   Smokeless tobacco: Never  Vaping Use   Vaping status: Every Day   Substances: Nicotine  Substance and Sexual Activity   Alcohol use: No    Comment: rare/social   Drug use: No   Sexual  activity: Yes    Birth control/protection: None  Other Topics Concern   Not on file  Social History Narrative   Not on file   Social Determinants of Health   Financial Resource Strain: Not on file  Food Insecurity: Not on file  Transportation Needs: Not on file  Physical Activity: Not on file  Stress: Not on file  Social Connections: Not on file  Intimate Partner Violence: Not on file    ROS  General:  Negative for unexplained weight loss, fever Skin: Negative for new or changing mole, sore that won't heal HEENT: Negative for trouble hearing, trouble seeing, ringing in ears, mouth sores, hoarseness, change in voice, dysphagia. CV:  Negative for chest pain, dyspnea, edema, palpitations Resp: Negative for cough, dyspnea, hemoptysis GI: Negative for nausea, vomiting, diarrhea, constipation, abdominal pain, melena, hematochezia. GU: Negative for dysuria, incontinence, urinary hesitance, hematuria, vaginal or penile discharge, polyuria, sexual difficulty, lumps in testicle or breasts MSK: Negative for muscle cramps or aches, joint pain or swelling Neuro: Negative for headaches, weakness, numbness, dizziness, passing out/fainting Psych: Negative for depression, anxiety, memory problems  Objective  Physical Exam Vitals:   04/24/23 1011  BP: 136/84  Pulse: 92  Temp: 98.1 F (36.7 C)  SpO2: 97%    BP Readings from Last 3 Encounters:  04/24/23 136/84  09/22/20 110/66  01/26/17 102/60   Wt Readings from Last 3 Encounters:  04/24/23 132 lb (59.9 kg)  09/22/20 120 lb (54.4 kg)  10/06/14 155 lb (70.3 kg)    Physical Exam Exam conducted with a chaperone present Donavan Foil, CMA).  Constitutional:      General: She is not in acute distress.    Appearance: Normal appearance.  HENT:     Head: Normocephalic.     Right Ear: Tympanic membrane normal.     Left Ear: Tympanic membrane normal.     Nose: Nose normal.     Mouth/Throat:     Mouth: Mucous membranes are  moist.     Pharynx: Oropharynx is clear.  Eyes:     Conjunctiva/sclera: Conjunctivae normal.     Pupils: Pupils are equal, round, and reactive to light.  Neck:     Thyroid: No thyromegaly.  Cardiovascular:     Rate and Rhythm: Normal rate and regular rhythm.     Heart sounds: Normal heart sounds.  Pulmonary:     Effort: Pulmonary effort is normal.     Breath sounds: Normal breath sounds.  Abdominal:     General: Abdomen is flat. Bowel sounds are normal.     Palpations: Abdomen is soft. There is no mass.     Tenderness: There is no abdominal tenderness.  Genitourinary:    Labia:        Right: No rash or lesion.        Left: No rash or lesion.      Comments: Cyst noted on right side pubic area. Hard. Mild erythema. No drainage. Mild tenderness.  Musculoskeletal:  General: Normal range of motion.  Lymphadenopathy:     Cervical: No cervical adenopathy.  Skin:    General: Skin is warm and dry.     Findings: No rash.  Neurological:     General: No focal deficit present.     Mental Status: She is alert.  Psychiatric:        Mood and Affect: Mood normal.        Behavior: Behavior normal.    Assessment/Plan:   Encounter for routine adult medical exam with abnormal findings Assessment & Plan: Physical exam complete. We will order lab work as outlined and contact patient with the results. Pap smear- UTD. She declined the flu vaccine today. Tetanus vaccine- UTD. She has never received any COVID vaccines. Counseled on alcohol use, she reports daily alcohol consumption, approximately three drinks per day. We advise reducing alcohol intake to no more than seven drinks per week to decrease health risks and potential for dependency. Recommended follow ups with dentist and ophthalmology for annual exams. Encouraged healthy diet and exercise. Return to care in one year, sooner PRN.   Orders: -     CBC with Differential/Platelet -     Comprehensive metabolic panel -     IBC +  Ferritin -     VITAMIN D 25 Hydroxy (Vit-D Deficiency, Fractures)  Vaginal odor Assessment & Plan: She presents with a recent onset of vaginal odor without accompanying discharge, dysuria, or urinary frequency, and no signs of a sexually transmitted infection. We will perform a vaginal swab to test for bacterial vaginosis, yeast and STDs. UA in office with protein only. Microscopic and culture pending. Further work up pending results.   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 We will order a pelvic ultrasound to further evaluate the cyst and refer her to general surgery for potential removal.   Orders: -     US PELVIS (TRANSABDOMINAL ONLY); Future  GAD (generalized anxiety disorder) Assessment & Plan: Her chronic anxiety is currently managed with Lorazepam 0.5mg  as needed, with recent life stressors contributing to increased anxiety. We will consider taking Lorazepam at bedtime to aid with sleep and continue follow-up with psychiatry.   Lipid screening -     Lipid panel  Thyroid disorder screen -     TSH  Encounter for hepatitis C screening test for low risk patient -     Hepatitis C antibody   Return in about 1 year (around 04/23/2024) for Annual Exam, sooner PRN.   Bethanie Dicker, NP-C [facility] - [employer]

## 2023-04-24 NOTE — Assessment & Plan Note (Signed)
Physical exam complete. We will order lab work as outlined and contact patient with the results. Pap smear- UTD. She declined the flu vaccine today. Tetanus vaccine- UTD. She has never received any COVID vaccines. Counseled on alcohol use, she reports daily alcohol consumption, approximately three drinks per day. We advise reducing alcohol intake to no more than seven drinks per week to decrease health risks and potential for dependency. Recommended follow ups with dentist and ophthalmology for annual exams. Encouraged healthy diet and exercise. Return to care in one year, sooner PRN.

## 2023-04-24 NOTE — Assessment & Plan Note (Signed)
Her chronic anxiety is currently managed with Lorazepam 0.5mg  as needed, with recent life stressors contributing to increased anxiety. We will consider taking Lorazepam at bedtime to aid with sleep and continue follow-up with psychiatry.

## 2023-04-24 NOTE — Assessment & Plan Note (Signed)
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 We will order a pelvic ultrasound to further evaluate the cyst and refer her to general surgery for potential removal.

## 2023-04-24 NOTE — Assessment & Plan Note (Signed)
She presents with a recent onset of vaginal odor without accompanying discharge, dysuria, or urinary frequency, and no signs of a sexually transmitted infection. We will perform a vaginal swab to test for bacterial vaginosis, yeast and STDs. UA in office with protein only. Microscopic and culture pending. Further work up pending results.

## 2023-04-25 ENCOUNTER — Ambulatory Visit
Admission: RE | Admit: 2023-04-25 | Discharge: 2023-04-25 | Disposition: A | Discharge status: Home / Self Care | Payer: BC Managed Care – PPO | Source: Ambulatory Visit | Attending: Nurse Practitioner | Admitting: Nurse Practitioner

## 2023-04-25 ENCOUNTER — Ambulatory Visit (INDEPENDENT_AMBULATORY_CARE_PROVIDER_SITE_OTHER): Payer: BC Managed Care – PPO

## 2023-04-25 ENCOUNTER — Other Ambulatory Visit: Payer: Self-pay | Admitting: Nurse Practitioner

## 2023-04-25 DIAGNOSIS — R748 Abnormal levels of other serum enzymes: Secondary | ICD-10-CM | POA: Insufficient documentation

## 2023-04-25 DIAGNOSIS — R718 Other abnormality of red blood cells: Secondary | ICD-10-CM | POA: Diagnosis not present

## 2023-04-25 DIAGNOSIS — L729 Follicular cyst of the skin and subcutaneous tissue, unspecified: Secondary | ICD-10-CM | POA: Insufficient documentation

## 2023-04-25 DIAGNOSIS — R1909 Other intra-abdominal and pelvic swelling, mass and lump: Secondary | ICD-10-CM | POA: Diagnosis not present

## 2023-04-25 DIAGNOSIS — B9689 Other specified bacterial agents as the cause of diseases classified elsewhere: Secondary | ICD-10-CM

## 2023-04-25 DIAGNOSIS — D696 Thrombocytopenia, unspecified: Secondary | ICD-10-CM | POA: Insufficient documentation

## 2023-04-25 LAB — CERVICOVAGINAL ANCILLARY ONLY
Bacterial Vaginitis (gardnerella): POSITIVE — AB
Candida Glabrata: NEGATIVE
Candida Vaginitis: NEGATIVE
Chlamydia: NEGATIVE
Comment: NEGATIVE
Comment: NEGATIVE
Comment: NEGATIVE
Comment: NEGATIVE
Comment: NEGATIVE
Comment: NORMAL
Neisseria Gonorrhea: NEGATIVE
Trichomonas: NEGATIVE

## 2023-04-25 LAB — B12 AND FOLATE PANEL
Folate: 8.6 ng/mL (ref >5.9)
Vitamin B-12: 362 pg/mL (ref 211–911)

## 2023-04-25 LAB — URINE CULTURE
MICRO NUMBER:: 15775675
Result:: NO GROWTH
SPECIMEN QUALITY:: ADEQUATE

## 2023-04-25 LAB — HEPATITIS C ANTIBODY: Hepatitis C Ab: NONREACTIVE

## 2023-04-25 MED ORDER — METRONIDAZOLE 0.75 % VA GEL: 1.0000 | Freq: Every day | VAGINAL | 0 refills | Status: DC

## 2023-05-01 ENCOUNTER — Encounter: Payer: Self-pay | Admitting: Nurse Practitioner

## 2023-05-03 ENCOUNTER — Other Ambulatory Visit: Payer: Self-pay | Admitting: Nurse Practitioner

## 2023-05-03 DIAGNOSIS — L989 Disorder of the skin and subcutaneous tissue, unspecified: Secondary | ICD-10-CM

## 2023-05-11 ENCOUNTER — Ambulatory Visit: Payer: BC Managed Care – PPO | Admitting: General Surgery

## 2023-05-11 ENCOUNTER — Encounter: Payer: Self-pay | Admitting: General Surgery

## 2023-05-11 VITALS — BP 143/90 | HR 96 | Temp 98.0°F | Ht 66.0 in

## 2023-05-11 DIAGNOSIS — R1909 Other intra-abdominal and pelvic swelling, mass and lump: Secondary | ICD-10-CM | POA: Diagnosis not present

## 2023-05-11 NOTE — Patient Instructions (Signed)
We will schedule you for an excision of this area in the office. You do not need a driver for this day but may bring someone with you.  We will send this out to pathology for final diagnosis.    Epidermoid Cyst Removal Epidermoid cyst removal is a procedure to remove a fluid-filled sac that forms under your skin (epidermoid cyst). This type of cyst is filled with a thick, oily substance (keratin) that is secreted by your skin glands. Epidermoid cysts may also be called epidermal cysts, or keratin cysts. Normally, the skin secretes this pasty material through a gland or a hair follicle. However, when a skin gland or hair follicle becomes blocked, an epidermoid cyst can form. You may need this procedure if you have an epidermal cyst that becomes large, uncomfortable, or inflamed. Tell a health care provider about: Any allergies you have. All medicines you are taking, including vitamins, herbs, eye drops, creams, and over-the-counter medicines. Any problems you or family members have had with anesthetic medicines. Any blood disorders you have. Any surgeries you have had. Any medical conditions you have now or have had. Whether you are pregnant or may be pregnant. What are the risks? Generally, this is a safe procedure. However, problems may occur, including: Recurrence of the cyst. Bleeding. Infection. Scarring. What happens before the procedure? Ask your health care provider about: Changing or stopping your regular medicines. This is especially important if you are taking diabetes medicines or blood thinners. Taking medicines such as aspirin and ibuprofen. These medicines can thin your blood. Do not take these medicines unless your health care provider tells you to take them. Taking over-the-counter medicines, vitamins, herbs, and supplements. If you have an inflamed or infected cyst, you may have to take antibiotic medicine before the cyst removal. Take your antibiotic as told by your health  care provider. Do not stop taking the antibiotic even if you start to feel better. Take a shower on the morning of your procedure. Your health care provider may ask you to use a germ-killing soap. What happens during the procedure?  You will be given a medicine to numb the area (local anesthetic). The skin around the cyst will be cleaned with a germ-killing solution. The health care provider will make a small incision in your skin over the cyst. The health care provider will separate the cyst from the surrounding tissues that are under your skin. If possible, the cyst will be removed undamaged (intact). If the cyst bursts (ruptures), it will be removed in pieces. After the cyst is removed, the health care provider will control any bleeding and close the incision with small stitches (sutures). Small incisions may not need sutures, and the bleeding will be controlled by applying direct pressure with gauze. The health care provider may apply antibiotic ointment and a bandage (dressing) over the incision. The procedure may vary among health care providers and hospitals. What happens after the procedure? If you are prescribed an antibiotic medicine or ointment, take or apply it as told by your health care provider. Do not stop using the antibiotic even if you start to feel better. Summary Epidermoid cyst removal is a procedure to remove a sac that has formed under your skin. You may need this procedure if you have an epidermoid cyst that becomes large, uncomfortable, or inflamed. The health care provider will make a small incision in your skin to remove the cyst. If you are prescribed an antibiotic medicine before the procedure, after the procedure, or  both, use the antibiotic as told by your health care provider. Do not stop using the antibiotic even if you start to feel better. This information is not intended to replace advice given to you by your health care provider. Make sure you discuss any  questions you have with your health care provider. Document Revised: 08/20/2019 Document Reviewed: 08/21/2019 Elsevier Patient Education  2024 [employer].

## 2023-05-15 ENCOUNTER — Other Ambulatory Visit (INDEPENDENT_AMBULATORY_CARE_PROVIDER_SITE_OTHER): Payer: BC Managed Care – PPO

## 2023-05-15 DIAGNOSIS — R718 Other abnormality of red blood cells: Secondary | ICD-10-CM | POA: Diagnosis not present

## 2023-05-15 DIAGNOSIS — D696 Thrombocytopenia, unspecified: Secondary | ICD-10-CM

## 2023-05-15 DIAGNOSIS — R748 Abnormal levels of other serum enzymes: Secondary | ICD-10-CM | POA: Diagnosis not present

## 2023-05-15 LAB — CBC WITH DIFFERENTIAL/PLATELET
Basophils Absolute: 0.1 10*3/uL (ref 0.0–0.1)
Basophils Relative: 1.4 % (ref 0.0–3.0)
Eosinophils Absolute: 0.1 10*3/uL (ref 0.0–0.7)
Eosinophils Relative: 1.2 % (ref 0.0–5.0)
HCT: 41.1 % (ref 36.0–46.0)
Hemoglobin: 13.9 g/dL (ref 12.0–15.0)
Lymphocytes Relative: 28.2 % (ref 12.0–46.0)
Lymphs Abs: 1.6 10*3/uL (ref 0.7–4.0)
MCHC: 33.9 g/dL (ref 30.0–36.0)
MCV: 104 fL — ABNORMAL HIGH (ref 78.0–100.0)
Monocytes Absolute: 0.5 10*3/uL (ref 0.1–1.0)
Monocytes Relative: 8.9 % (ref 3.0–12.0)
Neutro Abs: 3.4 10*3/uL (ref 1.4–7.7)
Neutrophils Relative %: 60.3 % (ref 43.0–77.0)
Platelets: 177 10*3/uL (ref 150.0–400.0)
RBC: 3.95 Mil/uL (ref 3.87–5.11)
RDW: 12.1 % (ref 11.5–15.5)
WBC: 5.6 10*3/uL (ref 4.0–10.5)

## 2023-05-15 LAB — HEPATIC FUNCTION PANEL
ALT: 37 U/L — ABNORMAL HIGH (ref 0–35)
AST: 40 U/L — ABNORMAL HIGH (ref 0–37)
Albumin: 4.1 g/dL (ref 3.5–5.2)
Alkaline Phosphatase: 39 U/L (ref 39–117)
Bilirubin, Direct: 0.2 mg/dL (ref 0.0–0.3)
Total Bilirubin: 0.4 mg/dL (ref 0.2–1.2)
Total Protein: 7.1 g/dL (ref 6.0–8.3)

## 2023-05-15 NOTE — Progress Notes (Signed)
Patient ID: Tonya Peterson, female   DOB: 26-Jul-1985, 37 y.o.   MRN: [id number] CC: Right Suprapubic Mass  History of Present Illness Tonya Peterson is a 37 y.o. female with past medical history as listed below who presents in consultation for a right suprapubic mass.  The patient reports that she noticed a lump just superior to her mons approximately a year ago.  She says that sometimes it does become painful but denies any drainage.  She denies that there is any overlying skin changes and is unsure whether it has grown over the last year or not.  She has never had any other masses removed before.  Past Medical History Past Medical History:  Diagnosis Date   ADD (attention deficit disorder)    ADHD (attention deficit hyperactivity disorder) 05/2011   Allergy    Chronic kidney disease    left cyst, one kidney stone   Depression    Frequent headaches    Skin abnormalities    precancerous moles       Past Surgical History:  Procedure Laterality Date   gum graft     ingrown toe nail Right    great toe   WISDOM TOOTH EXTRACTION      Allergies  Allergen Reactions   Amoxicillin    Egg-Derived Products     Nasal congestion, face turns hot in addition to stomach pain     Current Outpatient Medications  Medication Sig Dispense Refill   amphetamine-dextroamphetamine (ADDERALL) 30 MG tablet Take 1 tablet by mouth 2 (two) times daily.     LORazepam (ATIVAN) 0.5 MG tablet Take 0.5 mg by mouth daily as needed.     No current facility-administered medications for this visit.    Family History Family History  Problem Relation Age of Onset   Diabetes Mother    Hypertension Father    Cancer Maternal Grandfather    Diabetes Paternal Grandmother    Cancer Paternal Grandfather        Social History Social History   Tobacco Use   Smoking status: Former    Current packs/day: 0.00    Average packs/day: 0.5 packs/day for 6.6 years (3.3 ttl pk-yrs)    Types:  Cigarettes    Start date: 02/05/2008    Quit date: 09/06/2014    Years since quitting: 8.6    Passive exposure: Past   Smokeless tobacco: Never  Vaping Use   Vaping status: Every Day   Substances: Nicotine  Substance Use Topics   Alcohol use: Yes    Comment: rare/social   Drug use: No        ROS Full ROS of systems performed and is otherwise negative there than what is stated in the HPI  Physical Exam Blood pressure (!) 143/90, pulse 96, temperature 98 F (36.7 C), height 5\' 6"  (1.676 m), last menstrual period 05/02/2023, SpO2 99%, unknown if currently breastfeeding.  No acute distress, PERRLA, normal work of breathing on room air, regular rate and rhythm, abdomen is soft, nontender nondistended Exam of the mass was done in the presence of a chaperone.  Superior and lateral to the pubic symphysis there is a soft, ballotable mobile soft tissue mass.  It seems to measure approximately 1 cm.  There is some overlying hypopigmentation.  It is minimally tender to palpation on exam.  There is no drainage expressed from the mass.  Data Reviewed I reviewed the ultrasound images of the area.  This mass appears to be small with some internal vascularity.  I have personally reviewed the patient's imaging and medical records.    Assessment/Plan    Patient is a 37 year old with right suprapubic mass.  She would like it removed.  I discussed that we can do this in the office under local anesthetic.  I discussed the risk, benefits alternatives to the procedure including risk of infection, bleeding, recurrence of the lesion and potential for malignancy.  She understands these risks and wishes to proceed.  We will schedule her for an in office procedure     Kandis Cocking 05/15/2023, 9:43 AM

## 2023-05-16 ENCOUNTER — Encounter: Payer: Self-pay | Admitting: General Surgery

## 2023-05-16 ENCOUNTER — Ambulatory Visit: Payer: BC Managed Care – PPO | Admitting: General Surgery

## 2023-05-16 ENCOUNTER — Telehealth: Payer: Self-pay

## 2023-05-16 VITALS — BP 123/87 | HR 101 | Temp 98.4°F | Ht 66.0 in | Wt 129.0 lb

## 2023-05-16 DIAGNOSIS — R1909 Other intra-abdominal and pelvic swelling, mass and lump: Secondary | ICD-10-CM

## 2023-05-16 DIAGNOSIS — M7989 Other specified soft tissue disorders: Secondary | ICD-10-CM | POA: Diagnosis not present

## 2023-05-16 NOTE — Telephone Encounter (Signed)
LMOM for pt to CB in regards to labs 

## 2023-05-16 NOTE — Patient Instructions (Signed)
 We have removed a Cyst in our office today.  You have sutures under the skin that will dissolve and also dermabond (skin glue) on top of your skin which will come off on it's own in 10-14 days.  You may use Ibuprofen or Tylenol as needed for pain control. Use the ice pack 3-4 times a day for the next two days for any achiness.  You may shower tomorrow, do not scrub at the area.  Avoid Strenuous activities that will make you sweat during the next 48 hours to avoid the glue coming off prematurely. Avoid activities that will place pressure to this area of the body for 1-2 weeks to avoid re-injury to incision site.  Please see your follow-up appointment provided. We will see you back in office to make sure this area is healed and to review the final pathology. If you have any questions or concerns prior to this appointment, call our office and speak with a nurse.    Excision of Skin Cysts or Lesions Excision of a skin lesion refers to the removal of a section of skin by making small cuts (incisions) in the skin. This procedure may be done to remove a cancerous (malignant) or noncancerous (benign) growth on the skin. It is typically done to treat or prevent cancer or infection. It may also be done to improve cosmetic appearance. The procedure may be done to remove: Cancerous growths, such as basal cell carcinoma, squamous cell carcinoma, or melanoma. Noncancerous growths, such as a cyst or lipoma. Growths, such as moles or skin tags, which may be removed for cosmetic reasons.  Various excision or surgical techniques may be used depending on your condition, the location of the lesion, and your overall health. Tell a health care provider about: Any allergies you have. All medicines you are taking, including vitamins, herbs, eye drops, creams, and over-the-counter medicines. Any problems you or family members have had with anesthetic medicines. Any blood disorders you have. Any surgeries you have  had. Any medical conditions you have. Whether you are pregnant or may be pregnant. What are the risks? Generally, this is a safe procedure. However, problems may occur, including: Bleeding. Infection. Scarring. Recurrence of the cyst, lipoma, or cancer. Changes in skin sensation or appearance, such as discoloration or swelling. Reaction to the anesthetics. Allergic reaction to surgical materials or ointments. Damage to nerves, blood vessels, muscles, or other structures. Continued pain.  What happens before the procedure? Ask your health care provider about: Changing or stopping your regular medicines. This is especially important if you are taking diabetes medicines or blood thinners. Taking medicines such as aspirin and ibuprofen. These medicines can thin your blood. Do not take these medicines before your procedure if your health care provider instructs you not to. You may be asked to take certain medicines. You may be asked to stop smoking. You may have an exam or testing. What happens during the procedure? To reduce your risk of infection: Your health care team will wash or sanitize their hands. Your skin will be washed with soap. You will be given a medicine to numb the area (local anesthetic). One of the following excision techniques will be performed. At the end of any of these procedures, antibiotic ointment will be applied as needed. Each of the following techniques may vary among health care providers and hospitals. Complete Surgical Excision The area of skin that needs to be removed will be marked with a pen. Using a small scalpel or scissors, the surgeon  will gently cut around and under the lesion until it is completely removed. The lesion will be placed in a fluid and sent to the lab for examination. If necessary, bleeding will be controlled with a device that delivers heat (electrocautery). The edges of the wound may be stitched (sutured) together, and a bandage  (dressing) or surgical glue will be applied. This procedure may be performed to treat a cancerous growth or a noncancerous cyst or lesion.  What happens after the procedure? Return to your normal activities as told by your health care provider. Report any excessive bleeding, spreading redness, or increased pain.

## 2023-05-17 ENCOUNTER — Telehealth: Payer: Self-pay | Admitting: General Surgery

## 2023-05-17 LAB — SURGICAL PATHOLOGY

## 2023-05-17 NOTE — Telephone Encounter (Signed)
Please call Sammie with [facility] at [phone]. Patient had procedure done in office yesterday with Dr. Maurine Minister and they have questions regarding specimen.  Thank you.

## 2023-05-22 NOTE — Progress Notes (Signed)
Preop diagnosis: Mons pubis mass Postop diagnosis: Same Procedure: Excision of mons pubis mass size approximately 2 cm Surgeon: Baker Pierini, MD  After informed consent was obtained the patient was placed supine on our procedure room table.  The skin of the mons pubis was then cleaned with ChloraPrep.  A field block with 1% lidocaine with epinephrine was then used for patient comfort.  An incision was made just lateral to the mass.  This was dissected out from the subcutaneous tissue and excised fully.  It was then passed off and sent to pathology.  There was some minor bleeding that was controlled with direct plantar pressure.  The subcutaneous tissue was closed with 3-0 Vicryl and the skin was closed with 4-0 Monocryl.  It was dressed with Steri-Strips.

## 2023-05-25 ENCOUNTER — Encounter: Payer: Self-pay | Admitting: General Surgery

## 2023-05-25 ENCOUNTER — Ambulatory Visit: Payer: BC Managed Care – PPO | Admitting: General Surgery

## 2023-05-25 VITALS — BP 145/93 | HR 93 | Temp 98.8°F | Ht 66.0 in | Wt 135.6 lb

## 2023-05-25 DIAGNOSIS — R1909 Other intra-abdominal and pelvic swelling, mass and lump: Secondary | ICD-10-CM

## 2023-05-25 DIAGNOSIS — Z09 Encounter for follow-up examination after completed treatment for conditions other than malignant neoplasm: Secondary | ICD-10-CM

## 2023-05-25 DIAGNOSIS — R1900 Intra-abdominal and pelvic swelling, mass and lump, unspecified site: Secondary | ICD-10-CM

## 2023-05-25 MED ORDER — OXYCODONE HCL 5 MG PO TABS: 5.0000 mg | ORAL_TABLET | Freq: Three times a day (TID) | ORAL | 0 refills | Status: DC | PRN

## 2023-05-25 NOTE — Patient Instructions (Signed)
Hematoma A hematoma is a collection of blood. A hematoma can happen: Under the skin. In an organ. In a body space. In a joint space. In other tissues. The blood can thicken (clot) to form a lump that you can see and feel. The lump is often hard and may become sore and tender. The lump can be very small or very big. Most hematomas get better in a few days to weeks. However, some of these may be serious and need medical care. What are the causes? This condition is caused by: An injury. Blood that leaks under the skin. Problems from surgeries. Medical conditions that cause bleeding or bruising. What increases the risk? You are more likely to develop this condition if: You are an older adult. You use medicines that thin your blood. You use NSAIDs, such as ibuprofen, often for pain. You play contact sports. What are the signs or symptoms? Symptoms depend on where the hematoma is in your body. If the hematoma is under the skin, there is: A firm lump on the body. Pain and tenderness in the area. Bruising. The skin above the lump may be blue, dark blue, purple-red, or yellowish. If the hematoma is deep in the tissues or body spaces, there may be: Blood in the stomach. This may cause pain in the belly (abdomen), weakness, passing out (fainting), and shortness of breath. Blood in the head. This may cause a headache, weakness, trouble speaking or understanding speech, or passing out. How is this treated? Treatment depends on the cause, size, and location of the hematoma. Treatment may include: Doing nothing. Many hematomas go away on their own without treatment. Surgery or close monitoring. This may be needed for large hematomas or hematomas that affect the body's organs. Medicines. These may be given if a medical condition caused the hematoma. Follow these instructions at home: Managing pain, stiffness, and swelling  If told, put ice on the injured area. To do this: Put ice in a plastic  bag. Place a towel between your skin and the bag. Leave the ice on for 20 minutes, 2-3 times a day for the first two days. If your skin turns bright red, take off the ice right away to prevent skin damage. The risk of skin damage is higher if you cannot feel pain, heat, or cold. If told, put heat on the injured area. Do this as often as told by your doctor. Use the heat source that your doctor recommends, such as a moist heat pack or a heating pad. Place a towel between your skin and the heat source. Leave the heat on for 20-30 minutes. If your skin turns bright red, take off the heat right away to prevent burns. The risk of burns is higher if you cannot feel pain, heat, or cold. Raise the injured area above the level of your heart while you are sitting or lying down. Wrap the affected area with an elastic bandage, if told by your doctor. Do not wrap the bandage too tight. If your hematoma is on a leg or foot and is painful, your doctor may give you crutches. Use them as told by your doctor. General instructions Take over-the-counter and prescription medicines only as told by your doctor. Keep all follow-up visits. Your doctor may want to see how your hematoma is healing with treatment. Contact a doctor if: You have a fever. The swelling or bruising gets worse. You start to get more hematomas. Your pain gets worse. Your pain is not getting better  with medicine. The skin over the hematoma breaks or starts to bleed. Get help right away if: Your hematoma is in your chest or belly and you: Pass out. Feel weak. Become short of breath. You have a hematoma on your scalp that is caused by a fall or injury, and you: Have a headache that gets worse. Have trouble speaking or understanding speech. Become less alert or you pass out. These symptoms may be an emergency. Get help right away. Call 911. Do not wait to see if the symptoms will go away. Do not drive yourself to the hospital This  information is not intended to replace advice given to you by your health care provider. Make sure you discuss any questions you have with your health care provider. Document Revised: 11/08/2021 Document Reviewed: 11/08/2021 Elsevier Patient Education  2024 [employer].

## 2023-05-25 NOTE — Addendum Note (Signed)
Addended by: Baker Pierini on: 05/25/2023 02:20 PM   Modules accepted: Orders

## 2023-05-25 NOTE — Progress Notes (Signed)
Outpatient Surgical Follow Up  05/25/2023  Tonya Peterson is an 37 y.o. female.   Chief Complaint  Patient presents with   Routine Post Op    Excision lesion 05/16/23    HPI: Patient returns today status post excision of mons pubis lesion.  She reports that she has continued pain over the incision and above onto her lower abdomen.  She has extensive bruising around the area.  She also has an opening of the wound with some bleeding from the area.  Past Medical History:  Diagnosis Date   ADD (attention deficit disorder)    ADHD (attention deficit hyperactivity disorder) 05/2011   Allergy    Chronic kidney disease    left cyst, one kidney stone   Depression    Frequent headaches    Skin abnormalities    precancerous moles    Past Surgical History:  Procedure Laterality Date   gum graft     ingrown toe nail Right    great toe   WISDOM TOOTH EXTRACTION      Family History  Problem Relation Age of Onset   Diabetes Mother    Hypertension Father    Cancer Maternal Grandfather    Diabetes Paternal Grandmother    Cancer Paternal Grandfather     Social History:  reports that she quit smoking about 8 years ago. Her smoking use included cigarettes. She started smoking about 15 years ago. She has a 3.3 pack-year smoking history. She has been exposed to tobacco smoke. She has never used smokeless tobacco. She reports current alcohol use. She reports that she does not use drugs.  Allergies:  Allergies  Allergen Reactions   Amoxicillin    Egg-Derived Products     Nasal congestion, face turns hot in addition to stomach pain     Medications reviewed.    ROS Full ROS performed and is otherwise negative other than what is stated in HPI   BP (!) 145/93   Pulse 93   Temp 98.8 F (37.1 C) (Oral)   Ht 5\' 6"  (1.676 m)   Wt 135 lb 9.6 oz (61.5 kg)   LMP 05/02/2023 (Exact Date)   SpO2 99%   BMI 21.89 kg/m   Physical Exam Lower abdomen there is extensive bruising  from her hip down to her mons.  There is a hematoma over the mons with a small opening of the wound that some hematoma is draining.  There is no evidence of infection or purulence from this area.    Pathology consistent with ruptured cyst.  Assessment/Plan:  1. Pelvic mass in female (Primary) Patient is status post excision of ruptured cyst with granulation tissue.  She has extensive bruising and a hematoma has formed at the site of the visit necessitating out of the wound.  There is no evidence of infection.  I discussed with her that we will just have to wait for the hematoma and the bruising to resolve.  She is having considerable amount of pain so we will give her some narcotics to help her with this.  We will plan to see her back in 2 weeks.    Baker Pierini, M.D. [facility]

## 2023-06-08 ENCOUNTER — Encounter: Payer: Self-pay | Admitting: General Surgery

## 2023-06-08 ENCOUNTER — Ambulatory Visit: Payer: BC Managed Care – PPO | Admitting: General Surgery

## 2023-06-08 VITALS — BP 121/85 | HR 94 | Temp 98.0°F | Ht 66.0 in | Wt 135.0 lb

## 2023-06-08 DIAGNOSIS — R1909 Other intra-abdominal and pelvic swelling, mass and lump: Secondary | ICD-10-CM | POA: Diagnosis not present

## 2023-06-08 NOTE — Patient Instructions (Addendum)
 Keep the area clean and dry. Let us  know if the area worsens or has any spreading redness or thick drainage.   You may massage the area to help is soften up. Once the area is fully closed up you may use Vitamin E oil to the area to help with any scar formation.   Follow-up with our office in 3-4 weeks.  Please call and ask to speak with a nurse if you develop questions or concerns.

## 2023-06-08 NOTE — Progress Notes (Signed)
 Outpatient Surgical Follow Up  06/08/2023  Tonya Peterson is an 38 y.o. female.   Chief Complaint  Patient presents with   Follow-up    HPI: Patient returns status post excision of labial mass.  She initially had significant bruising with a postprocedural hematoma that was evacuated through the wound.  She says she continued to have lots of drainage from the wound.  She describes this as dark, old blood.  The draining has improved significantly and the bruising has also improved significantly.  She still has some pain with palpation over the area and minor drainage. She denies any fevers or chills.   Past Medical History:  Diagnosis Date   ADD (attention deficit disorder)    ADHD (attention deficit hyperactivity disorder) 05/2011   Allergy     Chronic kidney disease    left cyst, one kidney stone   Depression    Frequent headaches    Skin abnormalities    precancerous moles    Past Surgical History:  Procedure Laterality Date   gum graft     ingrown toe nail Right    great toe   WISDOM TOOTH EXTRACTION      Family History  Problem Relation Age of Onset   Diabetes Mother    Hypertension Father    Cancer Maternal Grandfather    Diabetes Paternal Grandmother    Cancer Paternal Grandfather     Social History:  reports that she quit smoking about 8 years ago. Her smoking use included cigarettes. She started smoking about 15 years ago. She has a 3.3 pack-year smoking history. She has been exposed to tobacco smoke. She has never used smokeless tobacco. She reports current alcohol use. She reports that she does not use drugs.  Allergies:  Allergies  Allergen Reactions   Amoxicillin    Egg-Derived Products     Nasal congestion, face turns hot in addition to stomach pain     Medications reviewed.    ROS Full ROS performed and is otherwise negative other than what is stated in HPI   BP 121/85   Pulse 94   Temp 98 F (36.7 C)   Ht 5' 6 (1.676 m)   Wt 135 lb  (61.2 kg)   LMP 05/02/2023 (Exact Date)   SpO2 99%   BMI 21.79 kg/m   Physical Exam No acute distress, normal work of breathing on room air, abdomen is soft and nontender.  The bruising that was up to her right iliac crest has resolved and the bruising along her lower abdomen and suprapubic region has also resolved.  She still has significant induration over the area of excision with a opening of the incision.  I was unable to express any drainage today but she says that it has been slowly draining.  There is no erythema or purulence from the wound suggest infection.    No results found for this or any previous visit (from the past 48 hours). No results found.  Assessment/Plan:  Is status post excision of a mons pubis cyst.  She did have a hematoma that has been drained out.  This is improving and her bruising has significantly improved as well.  There is still some induration.  I discussed her that she can massage the area to help resorb that induration tissue.  She will follow-up with us  in 4 weeks to see how she is doing   Jayson Endow, M.D. [facility]

## 2023-07-04 ENCOUNTER — Encounter: Payer: Self-pay | Admitting: General Surgery

## 2023-07-04 ENCOUNTER — Ambulatory Visit: Payer: BC Managed Care – PPO | Admitting: General Surgery

## 2023-07-04 VITALS — BP 139/92 | HR 92 | Temp 98.5°F | Ht 66.0 in | Wt 129.6 lb

## 2023-07-04 DIAGNOSIS — R1909 Other intra-abdominal and pelvic swelling, mass and lump: Secondary | ICD-10-CM

## 2023-07-04 NOTE — Progress Notes (Signed)
 Outpatient Surgical Follow Up CC: Mons pubis mass 07/04/2023  Tonya Peterson is an 38 y.o. female.   Chief Complaint  Patient presents with   Follow-up    Excision cyst 05/16/23    HPI: Patient seen status post excision of right mons pubis mass.  Initial course complicated by opening of wound with hematoma drainage bruising.  She says that she is doing better.  The swelling has gone down.  There is no further bruising.  She does have an area of scabbing that she is concerned is a spitting stitch.  She denies any fevers or chills or further drainage from the wound  Past Medical History:  Diagnosis Date   ADD (attention deficit disorder)    ADHD (attention deficit hyperactivity disorder) 05/2011   Allergy     Chronic kidney disease    left cyst, one kidney stone   Depression    Frequent headaches    Skin abnormalities    precancerous moles    Past Surgical History:  Procedure Laterality Date   gum graft     ingrown toe nail Right    great toe   WISDOM TOOTH EXTRACTION      Family History  Problem Relation Age of Onset   Diabetes Mother    Hypertension Father    Cancer Maternal Grandfather    Diabetes Paternal Grandmother    Cancer Paternal Grandfather     Social History:  reports that she quit smoking about 8 years ago. Her smoking use included cigarettes. She started smoking about 15 years ago. She has a 3.3 pack-year smoking history. She has been exposed to tobacco smoke. She has never used smokeless tobacco. She reports current alcohol use. She reports that she does not use drugs.  Allergies:  Allergies  Allergen Reactions   Amoxicillin    Egg-Derived Products     Nasal congestion, face turns hot in addition to stomach pain     Medications reviewed.    ROS Full ROS performed and is otherwise negative other than what is stated in HPI   BP (!) 139/92   Pulse 92   Temp 98.5 F (36.9 C) (Oral)   Ht 5' 6 (1.676 m)   Wt 129 lb 9.6 oz (58.8 kg)    SpO2 97%   BMI 20.92 kg/m   Physical Exam Exam done in the presence of a chaperone.  Right mons examined and the scar is healing well.  There is still some induration tissue but it is improved from previous.  There is no bruising.  She has an area of scabbing.  I cut the scab out as there is some Monocryl that is still under this.    No results found for this or any previous visit (from the past 48 hours). No results found.  Assessment/Plan:  Patient status post excision of right mons mass.  She is doing better now.  Some part of the Monocryl has spit at the top of the wound.  I have excised this.  She can follow-up with us  on an as-needed basis now   Jayson Endow, M.D. [facility]

## 2023-07-04 NOTE — Patient Instructions (Signed)
 Follow up as needed

## 2023-09-11 ENCOUNTER — Ambulatory Visit: Payer: Self-pay

## 2023-09-11 DIAGNOSIS — F902 Attention-deficit hyperactivity disorder, combined type: Secondary | ICD-10-CM | POA: Diagnosis not present

## 2023-09-11 DIAGNOSIS — F4323 Adjustment disorder with mixed anxiety and depressed mood: Secondary | ICD-10-CM | POA: Diagnosis not present

## 2023-09-11 NOTE — Telephone Encounter (Signed)
 Copied from CRM [id number]. Topic: Clinical - Red Word Triage >> Sep 11, 2023  1:48 PM [place] E wrote: [facility] that prompted transfer to Nurse Triage: Worsening nose bleeds. Patient has had nose bleeds on/off, but has worsened over the past couple days. Patient also experiencing a rash on her left shin, waistline, and a spot on her back that has become red, scaly, and sometimes itchy.   Chief Complaint: Nosebleed  Symptoms: Nosebleed, rash  Frequency: Intermittent  Disposition: [] ED /[] Urgent Care (no appt availability in office) / [x] Appointment(In office/virtual)/ []  [facility]/ [] Home Care/ [] Refused Recommended Disposition /[] [facility]/ []  Follow-up with PCP Additional Notes: Patient reports that she has nosebleeds every year, but states that she is having more difficulty controlling them now. She states that her most recent nosebleed was 1 hour ago, which she has under control at this time. She states that she has been using Afrin with some relief but is out of it now. Patient requesting an appointment for evaluation of her nosebleeds as well as a rash that she describes as red, scaly, and sometime itchy, that has been present for 3-4 months. Appointment made for the patient tomorrow for evaluation. Patient instructed to call back for new or worsening symptoms and when to go to the ED for treatment. Patient verbalized understanding and agreement with this plan.      Reason for Disposition  Hard-to-stop nosebleeds are a chronic symptom (recurrent or ongoing AND present > 4 weeks)  Answer Assessment - Initial Assessment Questions 1. AMOUNT OF BLEEDING: "How bad is the bleeding?" "How much blood was lost?" "Has the bleeding stopped?"   - MILD: needed a couple tissues   - MODERATE: needed many tissues   - SEVERE: large blood clots, soaked many tissues, lasted more than 30 minutes      Moderate to severe  2. ONSET: "When did the nosebleed start?"      1 hour ago   3. FREQUENCY: "How many nosebleeds have you had in the last 24 hours?"      Two  4. RECURRENT SYMPTOMS: "Have there been other recent nosebleeds?" If Yes, ask: "How long did it take you to stop the bleeding?" "What worked best?"      Yes 5. CAUSE: "What do you think caused this nosebleed?"     Seasonal allergies  6. LOCAL FACTORS: "Do you have any cold symptoms?", "Have you been rubbing or picking at your nose?"     No 7. SYSTEMIC FACTORS: "Do you have high blood pressure or any bleeding problems?"     No 8. BLOOD THINNERS: "Do you take any blood thinners?" (e.g., aspirin, clopidogrel / Plavix, coumadin, heparin). Notes: Other strong blood thinners include: Arixtra (fondaparinux), Eliquis (apixaban), Pradaxa (dabigatran), and Xarelto (rivaroxaban).     No 9. OTHER SYMPTOMS: "Do you have any other symptoms?" (e.g., lightheadedness)     Rash 10. PREGNANCY: "Is there any chance you are pregnant?" "When was your last menstrual period?"       No  Protocols used: Nosebleed-A-AH

## 2023-09-12 ENCOUNTER — Ambulatory Visit: Admitting: Family Medicine

## 2023-09-12 ENCOUNTER — Ambulatory Visit

## 2023-09-12 VITALS — BP 120/80 | HR 90 | Temp 98.8°F | Ht 66.0 in | Wt 133.0 lb

## 2023-09-12 DIAGNOSIS — D696 Thrombocytopenia, unspecified: Secondary | ICD-10-CM

## 2023-09-12 DIAGNOSIS — L989 Disorder of the skin and subcutaneous tissue, unspecified: Secondary | ICD-10-CM | POA: Diagnosis not present

## 2023-09-12 DIAGNOSIS — J3089 Other allergic rhinitis: Secondary | ICD-10-CM

## 2023-09-12 DIAGNOSIS — B350 Tinea barbae and tinea capitis: Secondary | ICD-10-CM | POA: Insufficient documentation

## 2023-09-12 DIAGNOSIS — L299 Pruritus, unspecified: Secondary | ICD-10-CM | POA: Diagnosis not present

## 2023-09-12 DIAGNOSIS — R040 Epistaxis: Secondary | ICD-10-CM | POA: Insufficient documentation

## 2023-09-12 DIAGNOSIS — J302 Other seasonal allergic rhinitis: Secondary | ICD-10-CM

## 2023-09-12 MED ORDER — MUPIROCIN 2 % EX OINT: 1.0000 | TOPICAL_OINTMENT | Freq: Two times a day (BID) | CUTANEOUS | 0 refills | Status: AC

## 2023-09-12 MED ORDER — KETOCONAZOLE 2 % EX SHAM: MEDICATED_SHAMPOO | CUTANEOUS | Status: DC

## 2023-09-12 MED ORDER — ITRACONAZOLE 100 MG PO CAPS: 200.0000 mg | ORAL_CAPSULE | Freq: Every day | ORAL | Status: AC

## 2023-09-12 NOTE — Assessment & Plan Note (Addendum)
 Reviewed previous CBCs and thrombocytopenia seems to be chronic in nature.  We will repeat CBC, if patient continues to have thrombocytopenia recommend obtaining B12, iron panel, peripheral smear and potential referral to medical oncology for further evaluation.

## 2023-09-12 NOTE — Assessment & Plan Note (Signed)
 Defer to plan for tinea capitis.

## 2023-09-12 NOTE — Patient Instructions (Addendum)
--   Technique to stop nose bleed as discussed during your appointment. You can use Mupirocin antibiotic ointment, using qtip.  -- Please stop using all nasal spray for now.  -- Start taking Itraconazole 200 mg, once a day for 1 week. No alcohol when you are on this medication. I also put a dermatology referral. If you do not get to see them in 1 month please reach out.  -- Use Ketoconazole shampoo twice a week.  -- I will update you with lab results. If your platelets are low I will put a referral to hematooncology.

## 2023-09-12 NOTE — Progress Notes (Signed)
 Acute Office Visit  Subjective:    Patient ID: Tonya Peterson, female    DOB: May 30, 1986, 38 y.o.   MRN: [id number]  Chief Complaint  Patient presents with   Epistaxis   Rash  Established patient of Bethanie Dicker presenting for following acute concerns:   Epistaxis  The bleeding has been from both nares. This is a recurrent problem. The current episode started yesterday (Most recent episode: L sided nose bleed, she was bendig over to pick something, prior to that bleeding from right nostril on 4/13). Episode frequency: When bleeding, lasting for anywhere fomr 5-10 minutes. The problem has been waxing and waning. Associated with: Patient has been using Afrin for about 3 weeks, nose bleed seems to corelate when she stopps using Afrin. Not on aspirin, anticoagulation, no recent illness, URI. She has tried pressure for the symptoms. Her past medical history is significant for allergies, frequent nosebleeds (for about 3 years, on and off) and sinus problems.  Rash The affected locations include the scalp, left lower leg and back. The rash is characterized by scaling and itchiness. Associated with: Has cats, dogs, chicken at home. Also works in her garden, Pertinent negatives include no joint pain. Past treatments include nothing. Her past medical history is significant for allergies.  She has ADHD, insomnia and follows up with [facility]. Patient works as a bar tender. She drinks about 6 alcoholic beverage per day.    ROS: As per HPI    Objective:    BP 120/80   Pulse 90   Temp 98.8 F (37.1 C) (Oral)   Ht 5\' 6"  (1.676 m)   Wt 133 lb (60.3 kg)   SpO2 98%   BMI 21.47 kg/m    Physical Exam Constitutional:      Appearance: Normal appearance.  HENT:     Head: Normocephalic.     Comments: Multiple scalp lesions with scaly patches, with some erythema, no pustules, oozing or patchy hair loss.     Nose: No congestion or rhinorrhea.     Mouth/Throat:     Mouth: Mucous  membranes are moist.     Pharynx: No oropharyngeal exudate.  Cardiovascular:     Rate and Rhythm: Normal rate.  Pulmonary:     Effort: Pulmonary effort is normal.     Breath sounds: Normal breath sounds.  Abdominal:     Palpations: Abdomen is soft.  Musculoskeletal:     Cervical back: Normal range of motion and neck supple. No rigidity.     Right lower leg: No edema.     Left lower leg: No edema.  Lymphadenopathy:     Cervical: No cervical adenopathy.  Skin:    General: Skin is warm.     Comments: Multiple papular, erythematous lesions with scale noted on patient's back, scalp and left shin. Also has bruises on left hip, forearm.   Neurological:     Mental Status: She is alert and oriented to person, place, and time.  Psychiatric:        Mood and Affect: Mood normal. Affect is tearful.        Behavior: Behavior is cooperative.     No results found for any visits on 09/12/23.     Assessment & Plan:  Skin lesion Assessment & Plan: Defer to plan for tinea capitis  Orders: -     Ambulatory referral to Dermatology  Low platelet count (HCC) Assessment & Plan: Reviewed previous CBCs and thrombocytopenia seems to be chronic in nature.  We will repeat CBC, if patient continues to have thrombocytopenia recommend obtaining B12, iron panel, peripheral smear and potential referral to medical oncology for further evaluation.  Orders: -     CBC with Differential/Platelet -     Comprehensive metabolic panel with GFR  Itchy scalp Assessment & Plan: Defer to plan for tinea capitis.   Orders: -     Ketoconazole  Tinea capitis Assessment & Plan: Differential diagnosis includes tinea capitis, psoriasis.  Recommend referral to dermatology for potential biopsy and starting patient on prophylaxis therapy with itraconazole 200 mg once a day for 1 week.  Ketoconazole 2% shampoo twice a week for 1 month then as needed when patient is waiting to be seen by dermatology.  Patient was  counseled on potential medication side effect and recommended not to drink alcohol when she is on oral antifungal medication.    - Ambulatory referral to Dermatology - ketoconazole (NIZORAL) 2 % shampoo - itraconazole (SPORANOX) capsule 200 mg   Orders: -     Itraconazole  Bleeding from the nose Assessment & Plan: Recommend against using Afrin or any other nasal spray at this time. Keep humidifier in room. Use Mupirocin antibiotic prn. If continues to be a problem despite discontinuing Afrin recommend referral to ENT. Will obtain CBC given h/o thrombocytopenia. Proper home technique to stop nasal bleeding was demonstrated in clinic and printed information was provided to the patient as well.    Seasonal and perennial allergic rhinitis Assessment & Plan: Recommend taking OTC antihistamine like Allegra, Zyrtec, Xyzal, Claritin daily.  Patient can also try nasal Flonase/Flonase Sensimist for seasonal allergies instead of using Afrin.   Other orders -     Mupirocin; Apply 1 Application topically 2 (two) times daily. Inside nostril, twice a day as needed  Dispense: 22 g; Refill: 0   have spent a total time of 45-minutes on the day of the appointment reviewing prior documentation, coordinating care, and discussing medical diagnosis and plan with the patient. Labs and tests included in this note have been reviewed and interpreted independently by me.  Return in about 4 weeks (around 10/10/2023) for Follow up with Kacy for other medical concerns and reevaluate the rashes .  [name], MD

## 2023-09-12 NOTE — Assessment & Plan Note (Signed)
 Recommend taking OTC antihistamine like Allegra, Zyrtec, Xyzal, Claritin daily.  Patient can also try nasal Flonase/Flonase Sensimist for seasonal allergies instead of using Afrin.

## 2023-09-12 NOTE — Assessment & Plan Note (Signed)
 Differential diagnosis includes tinea capitis, psoriasis.  Recommend referral to dermatology for potential biopsy and starting patient on prophylaxis therapy with itraconazole 200 mg once a day for 1 week.  Ketoconazole 2% shampoo twice a week for 1 month then as needed when patient is waiting to be seen by dermatology.  Patient was counseled on potential medication side effect and recommended not to drink alcohol when she is on oral antifungal medication.    - Ambulatory referral to Dermatology - ketoconazole (NIZORAL) 2 % shampoo - itraconazole (SPORANOX) capsule 200 mg

## 2023-09-12 NOTE — Assessment & Plan Note (Signed)
 Recommend against using Afrin or any other nasal spray at this time. Keep humidifier in room. Use Mupirocin antibiotic prn. If continues to be a problem despite discontinuing Afrin recommend referral to ENT. Will obtain CBC given h/o thrombocytopenia. Proper home technique to stop nasal bleeding was demonstrated in clinic and printed information was provided to the patient as well.

## 2023-09-13 ENCOUNTER — Other Ambulatory Visit: Payer: Self-pay

## 2023-09-13 DIAGNOSIS — B350 Tinea barbae and tinea capitis: Secondary | ICD-10-CM

## 2023-09-13 DIAGNOSIS — R748 Abnormal levels of other serum enzymes: Secondary | ICD-10-CM

## 2023-09-13 LAB — CBC WITH DIFFERENTIAL/PLATELET
Basophils Absolute: 0.1 10*3/uL (ref 0.0–0.1)
Basophils Relative: 0.9 % (ref 0.0–3.0)
Eosinophils Absolute: 0.1 10*3/uL (ref 0.0–0.7)
Eosinophils Relative: 0.8 % (ref 0.0–5.0)
HCT: 42.9 % (ref 36.0–46.0)
Hemoglobin: 14.3 g/dL (ref 12.0–15.0)
Lymphocytes Relative: 27.6 % (ref 12.0–46.0)
Lymphs Abs: 1.7 10*3/uL (ref 0.7–4.0)
MCHC: 33.3 g/dL (ref 30.0–36.0)
MCV: 103.6 fl — ABNORMAL HIGH (ref 78.0–100.0)
Monocytes Absolute: 0.5 10*3/uL (ref 0.1–1.0)
Monocytes Relative: 8.6 % (ref 3.0–12.0)
Neutro Abs: 3.8 10*3/uL (ref 1.4–7.7)
Neutrophils Relative %: 62.1 % (ref 43.0–77.0)
Platelets: 211 10*3/uL (ref 150.0–400.0)
RBC: 4.14 Mil/uL (ref 3.87–5.11)
RDW: 13.7 % (ref 11.5–15.5)
WBC: 6.1 10*3/uL (ref 4.0–10.5)

## 2023-09-13 LAB — COMPREHENSIVE METABOLIC PANEL WITH GFR
ALT: 64 U/L — ABNORMAL HIGH (ref 0–35)
AST: 87 U/L — ABNORMAL HIGH (ref 0–37)
Albumin: 4.6 g/dL (ref 3.5–5.2)
Alkaline Phosphatase: 43 U/L (ref 39–117)
BUN: 10 mg/dL (ref 6–23)
CO2: 26 meq/L (ref 19–32)
Calcium: 9.2 mg/dL (ref 8.4–10.5)
Chloride: 101 meq/L (ref 96–112)
Creatinine, Ser: 0.75 mg/dL (ref 0.40–1.20)
GFR: 102.87 mL/min (ref >60.00)
Glucose, Bld: 92 mg/dL (ref 70–99)
Potassium: 4.3 meq/L (ref 3.5–5.1)
Sodium: 139 meq/L (ref 135–145)
Total Bilirubin: 0.5 mg/dL (ref 0.2–1.2)
Total Protein: 7.9 g/dL (ref 6.0–8.3)

## 2023-09-13 NOTE — Progress Notes (Signed)
 1. Elevated liver enzymes (Primary) - Gamma GT; Future - Comp Met (CMET); Future - B12 and Folate Panel; Future   [name], MD

## 2023-10-04 ENCOUNTER — Other Ambulatory Visit: Payer: Self-pay

## 2023-10-04 ENCOUNTER — Encounter (HOSPITAL_COMMUNITY): Payer: Self-pay

## 2023-10-04 DIAGNOSIS — R040 Epistaxis: Secondary | ICD-10-CM

## 2023-10-04 MED ORDER — KETOCONAZOLE 2 % EX SHAM: 1.0000 | MEDICATED_SHAMPOO | CUTANEOUS | 0 refills | Status: DC

## 2023-10-04 NOTE — Progress Notes (Unsigned)
 [facility] clinic: FYI, Urgent referral to ENT made for recurrent epistaxis.   1. Frequent epistaxis (Primary) - Ambulatory referral to ENT  [name], MD

## 2023-10-04 NOTE — Telephone Encounter (Signed)
 Noted.

## 2023-10-05 MED ORDER — ITRACONAZOLE 100 MG PO CAPS: 200.0000 mg | ORAL_CAPSULE | Freq: Every day | ORAL | 0 refills | Status: DC

## 2023-10-05 NOTE — Addendum Note (Signed)
 Addended by: Tene Gato on: 10/05/2023 08:28 AM   Modules accepted: Orders

## 2023-10-11 ENCOUNTER — Ambulatory Visit

## 2023-10-11 ENCOUNTER — Other Ambulatory Visit: Payer: Self-pay

## 2023-10-11 VITALS — BP 118/84 | HR 110 | Temp 98.5°F | Ht 66.0 in | Wt 131.8 lb

## 2023-10-11 DIAGNOSIS — R040 Epistaxis: Secondary | ICD-10-CM

## 2023-10-11 DIAGNOSIS — H93293 Other abnormal auditory perceptions, bilateral: Secondary | ICD-10-CM | POA: Diagnosis not present

## 2023-10-11 DIAGNOSIS — R21 Rash and other nonspecific skin eruption: Secondary | ICD-10-CM | POA: Diagnosis not present

## 2023-10-11 DIAGNOSIS — H9012 Conductive hearing loss, unilateral, left ear, with unrestricted hearing on the contralateral side: Secondary | ICD-10-CM | POA: Diagnosis not present

## 2023-10-11 DIAGNOSIS — G4700 Insomnia, unspecified: Secondary | ICD-10-CM

## 2023-10-11 DIAGNOSIS — D696 Thrombocytopenia, unspecified: Secondary | ICD-10-CM

## 2023-10-11 DIAGNOSIS — F10220 Alcohol dependence with intoxication, uncomplicated: Secondary | ICD-10-CM | POA: Diagnosis not present

## 2023-10-11 DIAGNOSIS — J302 Other seasonal allergic rhinitis: Secondary | ICD-10-CM | POA: Diagnosis not present

## 2023-10-11 NOTE — Assessment & Plan Note (Addendum)
 AUDIT-C for Alcohol Use from [employer]co[employer]  on 10/11/2023 RESULT SUMMARY: 11 points Scores >=5 are consistent with alcohol misuse and possible liver damage. INPUTS: How often did you have a drink containing alcohol in the past year? --> 4 = Four or more times a week How many drinks containing alcohol did you have on a typical day when you were drinking in the past year? --> 3 = 7 to 9 How often did you have six or more drinks on one occasion in the past year? --> 4 = Daily or almost daily.  Patient is also on Benzodiazepine for anxiety and insomnia prescribed through [facility]. Counseling advised on health and legal implications including employment consequences, driving safety, family, custody, legal consequences from driving under the influence. Encouraged to avoid alcohol use and treatment recommendations discussed.   Patient provided with inpatient and outpatient resources for treatment of alcohol dependence.  Counseling provided on s/s for DT as well.  Patient had consumed alcohol before her appointment. Recommend not driving when intoxicated.  Patient voluntarily gave her car keys to our office manager Thersia Flax and has a friend picking her up from the clinic today.   Pharmacological and non-pharmacological intervention to achieve treatment for alcohol dependence discussed with the patient.  Patient will let us  know once she calls the resources provided today to let us  know about next steps for treatment for alcohol dependence.  Reviewed recent AST, ALT elevated result with the patient.

## 2023-10-11 NOTE — Progress Notes (Addendum)
 Established Patient Office Visit   Subjective  Patient ID: Tonya Peterson, female    DOB: Sep 17, 1985  Age: 38 y.o. MRN: [id number]  Chief Complaint  Patient presents with   Rash   Epistaxis    She  has a past medical history of ADD (attention deficit disorder), ADHD (attention deficit hyperactivity disorder) (05/2011), Allergy , Chronic kidney disease, Depression, Frequent headaches, and Skin abnormalities.  HPI Patient presents for follow up on skin rash, frequent epistaxis.   1) Patient was seen by me on 09/12/23 for epistaxis and skin lesions.  - For recurrent epistaxis ENT referral was made. ENT appointment scheduled for 10/11/23.  - For skin rash: She has not started oral Itraconazole  yet. She now has rashes on right lower leg, lower back, continues to have rashes on scalp. She is waiting to hear back from dermatology.   2) Alcohol dependence:  - Works at a bar - Quantity: At least 4-7 drinks/day - Frequency: everyday - Withdrawal symptoms: Tremors when she does not drink. She smells of alcohol this morning. She reports she had alcoholic beverage this AM and drove herself to the clinic. Her 101 year old son is with her ex-husband for this week. He was with her during the weekend.  She just went through a divorce. - Past attempts to quit: She has tried quitting in the past. Last effort was in 09/2021.    AUDIT-C for Alcohol Use from [employer]  on 10/11/2023 RESULT SUMMARY: 11 points Scores >=5 are consistent with alcohol misuse and possible liver damage. INPUTS: How often did you have a drink containing alcohol in the past year? --> 4 = Four or more times a week How many drinks containing alcohol did you have on a typical day when you were drinking in the past year? --> 3 = 7 to 9 How often did you have six or more drinks on one occasion in the past year? --> 4 = Daily or almost daily  - Established with [facility] for ADHD, insomnia management. Is not  looking for inpatient therapy to assist for alcohol dependence.   ROS As per HPI    Objective:     BP 118/84   Pulse (!) 110   Temp 98.5 F (36.9 C) (Oral)   Ht 5\' 6"  (1.676 m)   Wt 131 lb 12.8 oz (59.8 kg)   SpO2 96%   BMI 21.27 kg/m      10/11/2023    9:10 AM 04/24/2023   10:15 AM  Depression screen PHQ 2/9  Decreased Interest 1 1  Down, Depressed, Hopeless 1 1  PHQ - 2 Score 2 2  Altered sleeping 2 3  Tired, decreased energy 2 3  Change in appetite 2 1  Feeling bad or failure about yourself  1 1  Trouble concentrating 1 2  Moving slowly or fidgety/restless 2 0  Suicidal thoughts 0 0  PHQ-9 Score 12 12  Difficult doing work/chores Somewhat difficult Somewhat difficult      10/11/2023    9:10 AM 04/24/2023   10:15 AM  GAD 7 : Generalized Anxiety Score  Nervous, Anxious, on Edge 3 3  Control/stop worrying 2 3  Worry too much - different things 2 3  Trouble relaxing 2 3  Restless 3 3  Easily annoyed or irritable 2 1  Afraid - awful might happen 1 3  Total GAD 7 Score 15 19  Anxiety Difficulty Somewhat difficult Somewhat difficult    Physical Exam Constitutional:  Appearance: Normal appearance.  HENT:     Head: Normocephalic and atraumatic.     Mouth/Throat:     Mouth: Mucous membranes are moist.  Neck:     Thyroid: No thyroid mass or thyroid tenderness.  Cardiovascular:     Rate and Rhythm: Normal rate and regular rhythm.  Pulmonary:     Effort: Pulmonary effort is normal.     Breath sounds: Normal breath sounds.  Abdominal:     General: Bowel sounds are normal.     Palpations: Abdomen is soft.  Musculoskeletal:     Cervical back: Neck supple. No rigidity.     Right lower leg: No edema.     Left lower leg: No edema.  Skin:    General: Skin is warm.     Comments: Multiple erythematous, plaque like lesions on lower back, b/l lower limbs, scalp  Neurological:     Mental Status: She is alert and oriented to person, place, and time.   Psychiatric:        Mood and Affect: Mood normal.        Behavior: Behavior normal. Behavior is cooperative.      No results found for any visits on 10/11/23.  The ASCVD Risk score (Arnett DK, et al., 2019) failed to calculate for the following reasons:   The 2019 ASCVD risk score is only valid for ages 75 to 21    Assessment & Plan:  Alcohol dependence with uncomplicated intoxication (HCC) Assessment & Plan: AUDIT-C for Alcohol Use from [employer]  on 10/11/2023 RESULT SUMMARY: 11 points Scores >=5 are consistent with alcohol misuse and possible liver damage. INPUTS: How often did you have a drink containing alcohol in the past year? --> 4 = Four or more times a week How many drinks containing alcohol did you have on a typical day when you were drinking in the past year? --> 3 = 7 to 9 How often did you have six or more drinks on one occasion in the past year? --> 4 = Daily or almost daily.  Patient is also on Benzodiazepine for anxiety and insomnia prescribed through [facility]. Counseling advised on health and legal implications including employment consequences, driving safety, family, custody, legal consequences from driving under the influence. Encouraged to avoid alcohol use and treatment recommendations discussed.   Patient provided with inpatient and outpatient resources for treatment of alcohol dependence.  Counseling provided on s/s for DT as well.  Patient had consumed alcohol before her appointment. Recommend not driving when intoxicated.  Patient voluntarily gave her car keys to our office manager Thersia Flax and has a friend picking her up from the clinic today.   Pharmacological and non-pharmacological intervention to achieve treatment for alcohol dependence discussed with the patient.  Patient will let us  know once she calls the resources provided today to let us  know about next steps for treatment for alcohol dependence.  Reviewed recent AST, ALT elevated  result with the patient.    Orders: -     AMB Referral VBCI Care Management  Skin rash Assessment & Plan: Rash resembling psoriasis. Recommend f/u with dermatology. Do not start Itraconazole . Given increased risk of autoimmune disease in patient who have AI disease (in patient's case potential psoriasis), given h/o thrombocytopenia, insomnia recommend screening for AI with the following: - Analyzer ANA IFA w/RFLX Titer/Pattern,Systemic Autoimmune Panel 1     Orders: -     Analyzer ANA IFA w/RFLX Titer/Pattern,Systemic Autoimmune Panel 1  Thrombocytopenia (HCC) Assessment & Plan:  Last CBC normal.   Orders: -     Analyzer ANA IFA w/RFLX Titer/Pattern,Systemic Autoimmune Panel 1  Insomnia, unspecified type -     Analyzer ANA IFA w/RFLX Titer/Pattern,Systemic Autoimmune Panel 1  Frequent epistaxis Assessment & Plan: F/U with ENT as scheduled on 10/11/23.     I spent 45 minutes on the day of this face to face encounter reviewing patient's medical records, recent labs, alcohol dependence history, counseling on different treatment measures, coordinating care with office manager Thersia Flax with patient's current status reviewing the assessment and plan with patient, and post visit ordering and reviewing of  diagnostics and therapeutics with patient .    Return in about 3 months (around 01/11/2024) for Transfer of care with Dr. Casimir Cleaver.   [name], MD

## 2023-10-11 NOTE — Assessment & Plan Note (Signed)
 Last CBC normal

## 2023-10-11 NOTE — Assessment & Plan Note (Signed)
 F/U with ENT as scheduled on 10/11/23.

## 2023-10-11 NOTE — Assessment & Plan Note (Addendum)
 Rash resembling psoriasis. Recommend f/u with dermatology. Do not start Itraconazole . Given increased risk of autoimmune disease in patient who have AI disease (in patient's case potential psoriasis), given h/o thrombocytopenia, insomnia recommend screening for AI with the following: - Analyzer ANA IFA w/RFLX Titer/Pattern,Systemic Autoimmune Panel 1

## 2023-10-11 NOTE — Patient Instructions (Addendum)
--   Start taking over the counter vitamin B complex, make sure it has Thiamine in it.  -- Okay to use antifungal shampoo, twice a week but do not take antifungal tablets.  -- Please reach out to the resources you were given in the clinic to schedule an appointment for outpatient detox/alcohol dependence therapy/treatment.

## 2023-10-11 NOTE — Progress Notes (Unsigned)
 1. Skin rash (Primary) - Ambulatory referral to Dermatology  Urgent referral to dermatology for spreading psoriatic rashes placed.  [name], MD

## 2023-10-11 NOTE — Addendum Note (Signed)
 Addended by: Bintou Lafata on: 10/11/2023 10:55 AM   Modules accepted: Orders

## 2023-10-12 ENCOUNTER — Telehealth: Payer: Self-pay | Admitting: *Deleted

## 2023-10-12 NOTE — Progress Notes (Signed)
 Complex Care Management Note Care Guide Note  10/12/2023 Name: Tonya Peterson MRN: [id number] DOB: [birth year]   Complex Care Management Outreach Attempts: An unsuccessful telephone outreach was attempted today to offer the patient information about available complex care management services.  Follow Up Plan:  Additional outreach attempts will be made to offer the patient complex care management information and services.   Encounter Outcome:  No Answer  Kandis Ormond, CMA [place]   [facility], [facility] Guide Direct Dial: [phone]  Fax: [phone] Website: [email or web address]

## 2023-10-13 NOTE — Progress Notes (Signed)
 Complex Care Management Note Care Guide Note  10/13/2023 Name: Brande Raker MRN: [id number] DOB: 1985-09-21   Complex Care Management Outreach Attempts: A second unsuccessful outreach was attempted today to offer the patient with information about available complex care management services.  Follow Up Plan:  Additional outreach attempts will be made to offer the patient complex care management information and services.   Encounter Outcome:  No Answer  Kandis Ormond, CMA [place]  [facility], [facility] Guide Direct Dial: [phone]  Fax: [phone] Website: [email or web address]

## 2023-10-16 NOTE — Progress Notes (Signed)
 Complex Care Management Note Care Guide Note  10/16/2023 Name: Tonya Peterson MRN: [id number] DOB: Mar 03, 1986   Complex Care Management Outreach Attempts: A third unsuccessful outreach was attempted today to offer the patient with information about available complex care management services.  Follow Up Plan:  No further outreach attempts will be made at this time. We have been unable to contact the patient to offer or enroll patient in complex care management services.  Encounter Outcome:  No Answer  Kandis Ormond, CMA [place]  [facility], [facility] Guide Direct Dial: [phone]  Fax: [phone] Website: [email or web address]

## 2023-10-17 NOTE — Progress Notes (Signed)
 Complex Care Management Note  Care Guide Note 10/17/2023 Name: Tonya Peterson MRN: [id number] DOB: [birth year]  Tonya Peterson is a 38 y.o. year old female who sees Bluford Burkitt, NP for primary care. I reached out to [place] by phone today to offer complex care management services.  Ms. Demelo was given information about Complex Care Management services today including:   The Complex Care Management services include support from the care team which includes your Nurse Care Manager, Clinical Social Worker, or Pharmacist.  The Complex Care Management team is here to help remove barriers to the health concerns and goals most important to you. Complex Care Management services are voluntary, and the patient may decline or stop services at any time by request to their care team member.   Complex Care Management Consent Status: Patient agreed to services and verbal consent obtained.   Follow up plan:  Telephone appointment with complex care management team member scheduled for:  11/02/2023  Encounter Outcome:  Patient Scheduled  Kandis Ormond, CMA [place]  [facility], [facility] Guide Direct Dial: [phone]  Fax: [phone] Website: [email or web address]

## 2023-10-19 ENCOUNTER — Ambulatory Visit: Payer: Self-pay

## 2023-10-19 LAB — ANALYZER(R)ANA IFA WITH REFLEX TITER/PATTRN,SYS AUTOIMM PNL1
Anti Nuclear Antibody (ANA): NEGATIVE
Anticardiolipin IgA: <2 [APL'U]/mL
Anticardiolipin IgG: <2 [GPL'U]/mL
Anticardiolipin IgM: <2 [MPL'U]/mL
Beta-2 Glyco 1 IgA: <2 U/mL
Beta-2 Glyco 1 IgM: <2 U/mL
Beta-2 Glyco I IgG: <2 U/mL
C3 Complement: 136 mg/dL (ref 83–193)
C4 Complement: 19 mg/dL (ref 15–57)
Centromere Ab Screen: <1 AI
Chromatin (Nucleosomal) Antibody: <1 AI
Cyclic Citrullin Peptide Ab: <16 U
DNA Ab (DS) Crithidia, IFA: NEGATIVE
ENA SM Ab Ser-aCnc: <1 AI
Jo-1 Autoabs: <1 AI
MUTATED CITRULLINATED VIMENTIN (MCV) AB: <20 U/mL (ref <20)
Rheumatoid Factor (IgA): 5 U
Rheumatoid Factor (IgG): <5 U
Rheumatoid Factor (IgM): <5 U
Ribonucleic Protein(ENA) Antibody, IgG: <1 AI
SM/RNP: <1 AI
SSA (Ro) (ENA) Antibody, IgG: <1 AI
SSB (La) (ENA) Antibody, IgG: <1 AI
Scleroderma (Scl-70) (ENA) Antibody, IgG: <1 AI
Thyroperoxidase Ab SerPl-aCnc: <1 [IU]/mL (ref <9)

## 2023-10-19 NOTE — Progress Notes (Signed)
 Please let the patient know screening for autoimmune disease came back negative. Has she been able to establish appointment with dermatology yet? Urgent referral to dermatology was done on 10/11/2023.   Thank you,  [name], MD

## 2023-11-02 ENCOUNTER — Other Ambulatory Visit: Payer: Self-pay | Admitting: *Deleted

## 2023-11-03 NOTE — Patient Outreach (Addendum)
 Complex Care Management   Visit Note  11/03/2023  Name:  Tonya Peterson MRN: [id number] DOB: [birth year]  Situation: Referral received for Complex Care Management related to Substance Abuse/Misuse alcohol I obtained verbal consent from Patient.  Visit completed with patient  on the phone on 11/02/23  Background:   Past Medical History:  Diagnosis Date   ADD (attention deficit disorder)    ADHD (attention deficit hyperactivity disorder) 05/2011   Allergy     Chronic kidney disease    left cyst, one kidney stone   Depression    Frequent headaches    Skin abnormalities    precancerous moles    Assessment: Patient Reported Symptoms:  Cognitive Cognitive Status: Alert and oriented to person, place, and time, Insightful and able to interpret abstract concepts, Normal speech and language skills      Neurological Neurological Review of Symptoms: Dizziness (standin up too fast-has not happended in a few years)    HEENT HEENT Symptoms Reported: Nasal discharge, Nosebleed HEENT Management Strategies: Medication therapy HEENT Comment: Seen by  ENT-10/04/23  prescribed a new ointment, pharmacy has not   filled yet, has cetirizine HCL 10mg  at nighttime    Cardiovascular Cardiovascular Symptoms Reported: Palpitations (may be due to anxiety-) Does patient have uncontrolled Hypertension?: No Cardiovascular Comment: sleeping better once placed on trrazadone  Respiratory Respiratory Symptoms Reported: No symptoms reported Respiratory Conditions: Seasonal allergies  Endocrine Patient reports the following symptoms related to hypoglycemia or hyperglycemia : No symptoms reported Is patient diabetic?: No    Gastrointestinal Gastrointestinal Symptoms Reported: No symptoms reported      Genitourinary Genitourinary Symptoms Reported: No symptoms reported    Integumentary Integumentary Symptoms Reported: Skin changes Additional Integumentary Details: moles on bottom of her feet, skin rash on  skin and scalp(red scaley patches in body) Skin Conditions: Itching, Psoriasis Skin Management Strategies: Medication therapy Skin Comment: referral for dermatology  Musculoskeletal Musculoskelatal Symptoms Reviewed: No symptoms reported        Psychosocial Psychosocial Symptoms Reported: Substance use Behavioral Health Conditions: ADHD/ADD, Substance use, Anxiety Substance Use: Alcohol Behavioral Management Strategies: Counseling, Coping strategies Major Change/Loss/Stressor/Fears (CP): Traumatic event Behaviors When Feeling Stressed/Fearful: drink , color, listen to music, garden , play with her animals Techniques to [employer] with Loss/Stress/Change: Diversional activities Quality of Family Relationships: involved, supportive Do you feel physically threatened by others?: No      11/02/2023   10:34 AM  Depression screen PHQ 2/9  Decreased Interest 0  Down, Depressed, Hopeless 1  PHQ - 2 Score 1    There were no vitals filed for this visit.  Medications Reviewed Today     Reviewed by Ave Leisure, LCSW (Social Worker) on 11/02/23 at 1033  Med List Status: <None>   Medication Order Taking? Sig Documenting Provider Last Dose Status Informant  amphetamine-dextroamphetamine (ADDERALL) 30 MG tablet [id number] Yes Take 1 tablet by mouth 2 (two) times daily. [provider] Taking Active   ketoconazole  (NIZORAL ) 2 % shampoo [id number] Yes Apply 1 Application topically 2 (two) times a week. Bair, Randa Burton, MD Taking Active   LORazepam (ATIVAN) 0.5 MG tablet [id number] Yes Take 0.5 mg by mouth 2 (two) times daily as needed. [provider] Taking Active   mupirocin  ointment (BACTROBAN ) 2 % [id number] No Apply 1 Application topically 2 (two) times daily. Inside nostril, twice a day as needed  Patient not taking: Reported on 11/02/2023   Bair, Kalpana, MD Not Taking Active  Med Note (Kensley Valladares M   Thu Nov 02, 2023 10:33 AM) Not taking  traZODone (DESYREL) 50 MG  tablet [id number] Yes Take 50 mg by mouth at bedtime. [provider] Taking Active             Recommendation:   PCP Follow-up CSW to provide resources for ongoing substance misuse follow up  Follow Up Plan:   Telephone follow-up 11/17/23  Yalitza Teed, LCSW [place]  [facility], [facility] Licensed Clinical Social Worker Care Coordinator  Direct Dial: [phone]

## 2023-11-03 NOTE — Patient Instructions (Signed)
 Visit Information  Thank you for taking time to visit with me today. Please don't hesitate to contact me if I can be of assistance to you before our next scheduled appointment.  Our next appointment is by telephone on 11/17/23 at 11am Please call the care guide team at [phone] if you need to cancel or reschedule your appointment.   Following is a copy of your care plan:   Goals Addressed             This Visit's Progress    VBCI Social Work Care Plan       Problems:   Substance Misuse: alcohol  CSW Clinical Goal(s):   Over the next 90 days the Patient will demonstrate improved adherence to prescribed treatment plan for substance misuse as evidenced byactive participation in outpatient substance misuse treatment.  Interventions:  Substance Misuse in a patient with Anxiety:   Evaluation of current treatment plan related to misuse of: alcohol Active listening / Reflection utilized related to current life challenges and triggers to alcohol use Behavioral Activation reviewed Emotional Support Provided Motivational Interviewing employed Participation in counseling encourage :  PHQ2/PHQ9 completed GAD 7 completed Solution-Focued Strategies employed: multiple treatment modalities discussed-patient interested in starting with outpatient substance abuse treatment  Patient Goals/Self-Care Activities:  Call agency of choice  to schedule your counseling appointment once resources are identified and received Continue taking your medication as prescribed.    Plan:   Telephone follow up appointment with care management team member scheduled for:  11/17/23        Please call the Suicide and Crisis Lifeline: 988 if you are experiencing a Mental Health or Behavioral Health Crisis or need someone to talk to.  Patient verbalizes understanding of instructions and care plan provided today and agrees to view in MyChart. Active MyChart status and patient understanding of how to access  instructions and care plan via MyChart confirmed with patient.     Taniya Dasher, LCSW [place]  [facility], [facility] Licensed Clinical Social Worker Care Coordinator  Direct Dial: [phone]

## 2023-11-06 ENCOUNTER — Other Ambulatory Visit: Payer: Self-pay

## 2023-11-06 DIAGNOSIS — J340 Abscess, furuncle and carbuncle of nose: Secondary | ICD-10-CM | POA: Diagnosis not present

## 2023-11-07 NOTE — Telephone Encounter (Signed)
 No further refill recommended. She was referred to dermatology previously. Can we check to see if she is established with a dermatologist yet?   Thank you, [name], MD

## 2023-11-10 NOTE — Telephone Encounter (Signed)
 Lvm for pt. Okayto relay questions

## 2023-11-17 ENCOUNTER — Encounter: Payer: Self-pay | Admitting: *Deleted

## 2023-11-17 ENCOUNTER — Telehealth: Payer: Self-pay | Admitting: *Deleted

## 2023-11-29 ENCOUNTER — Telehealth: Payer: Self-pay | Admitting: *Deleted

## 2023-11-29 NOTE — Patient Outreach (Signed)
 Phone call to patient to re-schedule missed appointment and to provide resources for ongoing mental health support.  Appointment re-scheduled for 12/18/23. Resources emailed tp [email or web address]   Lenn Mean, LCSW [facility]  [facility], [facility] Licensed Clinical Social Worker  Direct Dial: [phone]

## 2023-12-05 DIAGNOSIS — F902 Attention-deficit hyperactivity disorder, combined type: Secondary | ICD-10-CM | POA: Diagnosis not present

## 2023-12-05 DIAGNOSIS — F4323 Adjustment disorder with mixed anxiety and depressed mood: Secondary | ICD-10-CM | POA: Diagnosis not present

## 2023-12-07 ENCOUNTER — Other Ambulatory Visit: Payer: Self-pay

## 2023-12-07 DIAGNOSIS — L409 Psoriasis, unspecified: Secondary | ICD-10-CM | POA: Diagnosis not present

## 2023-12-18 ENCOUNTER — Other Ambulatory Visit: Payer: Self-pay | Admitting: *Deleted

## 2023-12-18 NOTE — Patient Instructions (Signed)
 Visit Information  Thank you for taking time to visit with me today. Please don't hesitate to contact me if I can be of assistance to you before our next scheduled appointment.  Your next care management appointment is by telephone on 01/03/24 at 11:30am  Telephone follow-up 01/03/24  Please call the care guide team at [phone] if you need to cancel, schedule, or reschedule an appointment.   Please call the Suicide and Crisis Lifeline: 988 if you are experiencing a Mental Health or Behavioral Health Crisis or need someone to talk to.  Crixus Mcaulay, LCSW [place]  [facility], [facility] Licensed Clinical Social Worker  Direct Dial: [phone]

## 2023-12-18 NOTE — Patient Outreach (Signed)
 Complex Care Management   Visit Note  12/18/2023  Name:  Tonya Peterson MRN: [id number] DOB: [birth year]  Situation: Referral received for Complex Care Management related to Substance Abuse/Misuse alcohol I obtained verbal consent from Patient.  Visit completed with patient  on the phone on 11/02/23    Background:   Past Medical History:  Diagnosis Date   ADD (attention deficit disorder)    ADHD (attention deficit hyperactivity disorder) 05/2011   Allergy     Chronic kidney disease    left cyst, one kidney stone   Depression    Frequent headaches    Skin abnormalities    precancerous moles    Assessment: Patient Reported Symptoms:  Cognitive Cognitive Status: Alert and oriented to person, place, and time, Insightful and able to interpret abstract concepts Cognitive/Intellectual Conditions Management [RPT]: None reported or documented in medical history or problem list   Health Maintenance Behaviors: None  Neurological Neurological Review of Symptoms: Dizziness (every once in a while when I get dehydrated or over heated)    HEENT HEENT Symptoms Reported: No symptoms reported      Cardiovascular Cardiovascular Symptoms Reported: Palpitations (per patient, may be connected to withdrawl symptoms) Does patient have uncontrolled Hypertension?: No Cardiovascular Management Strategies: Routine screening  Respiratory Respiratory Symptoms Reported: Shortness of breath Additional Respiratory Details: SOB when anxious, anxiety attacks pretty regularly-daily basis Respiratory Management Strategies: Adequate rest, Medication therapy Respiratory Self-Management Outcome: 3 (uncertain)  Endocrine Endocrine Symptoms Reported: No symptoms reported    Gastrointestinal Gastrointestinal Symptoms Reported: No symptoms reported      Genitourinary Genitourinary Symptoms Reported: No symptoms reported    Integumentary Integumentary Symptoms Reported: Skin changes Additional  Integumentary Details: moles on bottom of her feet, skin rash on skin and scalp Skin Management Strategies: Medication therapy Skin Comment: per patient, referral place for dermatology  Musculoskeletal Musculoskelatal Symptoms Reviewed: No symptoms reported        Psychosocial Psychosocial Symptoms Reported: Anxiety - if selected complete GAD, Substance use Additional Psychological Details: patient interested in IOP through [facility] for inital assessment Behavioral Management Strategies: Counseling, Coping strategies Major Change/Loss/Stressor/Fears (CP): Traumatic event Behaviors When Feeling Stressed/Fearful: listen to music, scrolling on phone, going to the pool Techniques to [place] with Loss/Stress/Change: Counseling, Diversional activities Quality of Family Relationships: supportive Do you feel physically threatened by others?: No      11/02/2023   10:34 AM  Depression screen PHQ 2/9  Decreased Interest 0  Down, Depressed, Hopeless 1  PHQ - 2 Score 1    There were no vitals filed for this visit.  Medications Reviewed Today   Medications were not reviewed in this encounter     Recommendation:   PCP Follow-up Contact Intensive Outpatient Program to schedule initial assessment [phone]  Follow Up Plan:   Telephone follow-up 01/03/24   Lenn Mean, LCSW [place]  [facility], [facility] Licensed Clinical Social Worker  Direct Dial: [phone]

## 2024-01-03 ENCOUNTER — Other Ambulatory Visit: Payer: Self-pay | Admitting: Nurse Practitioner

## 2024-01-03 ENCOUNTER — Other Ambulatory Visit: Payer: Self-pay | Admitting: *Deleted

## 2024-01-03 NOTE — Patient Outreach (Signed)
 Complex Care Management   Visit Note  01/03/2024  Name:  Tonya Peterson MRN: [id number] DOB: [birth year]  Situation: Referral received for Complex Care Management related to Substance Abuse/Misuse alcohol I obtained verbal consent from Patient. Visit completed with patient on the phone.  Background:   Past Medical History:  Diagnosis Date   ADD (attention deficit disorder)    ADHD (attention deficit hyperactivity disorder) 05/2011   Allergy     Chronic kidney disease    left cyst, one kidney stone   Depression    Frequent headaches    Skin abnormalities    precancerous moles    Assessment: Patient Reported Symptoms:  Cognitive Cognitive Status: Alert and oriented to person, place, and time, Insightful and able to interpret abstract concepts Cognitive/Intellectual Conditions Management [RPT]: None reported or documented in medical history or problem list   Health Maintenance Behaviors: None  Neurological Neurological Review of Symptoms: No symptoms reported    HEENT HEENT Symptoms Reported: No symptoms reported      Cardiovascular Cardiovascular Symptoms Reported: No symptoms reported    Respiratory Respiratory Symptoms Reported: Not assesed    Endocrine Endocrine Symptoms Reported: No symptoms reported    Gastrointestinal Gastrointestinal Symptoms Reported: No symptoms reported      Genitourinary Genitourinary Symptoms Reported: No symptoms reported    Integumentary Integumentary Symptoms Reported: Skin changes Additional Integumentary Details: skin rash from head to toe, has appt with dermatology on 01/04/24-current treatment not effective Skin Management Strategies: Medication therapy (not getting better,) Skin Self-Management Outcome: 2 (bad) Skin Comment: dermatology appt scheduled for 01/04/24  Musculoskeletal Musculoskelatal Symptoms Reviewed: No symptoms reported        Psychosocial Psychosocial Symptoms Reported: Anxiety - if selected complete GAD,  Substance use Additional Psychological Details: continued  interest in CD-IOP through [facility]-information provided Behavioral Management Strategies: Counseling, Coping strategies Major Change/Loss/Stressor/Fears (CP): Traumatic event Behaviors When Feeling Stressed/Fearful: music, scrolling on phone, spending time with son, going to pool, watching tx Techniques to Cope with Loss/Stress/Change: Counseling, Diversional activities Quality of Family Relationships: supportive Do you feel physically threatened by others?: No      11/02/2023   10:34 AM  Depression screen PHQ 2/9  Decreased Interest 0  Down, Depressed, Hopeless 1  PHQ - 2 Score 1    There were no vitals filed for this visit.  Medications Reviewed Today   Medications were not reviewed in this encounter     Recommendation:   PCP Follow-up Please contact CDIOP to discuss program specifics [phone]  Follow Up Plan:   Telephone follow-up 01/26/24    Lenn Mean, LCSW [place]  [facility], [facility] Licensed Clinical Social Worker  Direct Dial: [phone]

## 2024-01-03 NOTE — Patient Instructions (Signed)
 Visit Information  Thank you for taking time to visit with me today. Please don't hesitate to contact me if I can be of assistance to you before our next scheduled appointment.  Your next care management appointment is by telephone on 01/26/24 at 11am    Please call the care guide team at [phone] if you need to cancel, schedule, or reschedule an appointment.   Please call the Suicide and Crisis Lifeline: 988 call the [place]  National Suicide Prevention Lifeline: 562-625-4096 or TTY: 410 628 2940 TTY (786)382-2566) to talk to a trained counselor call 1-800-273-TALK (toll free, 24 hour hotline) if you are experiencing a Mental Health or Behavioral Health Crisis or need someone to talk to.  Tonya Hack, LCSW [place]  [facility], [facility] Licensed Clinical Social Worker  Direct Dial: [phone]

## 2024-01-04 ENCOUNTER — Encounter: Payer: Self-pay | Admitting: Dermatology

## 2024-01-04 ENCOUNTER — Ambulatory Visit: Admitting: Dermatology

## 2024-01-04 DIAGNOSIS — Z7189 Other specified counseling: Secondary | ICD-10-CM | POA: Diagnosis not present

## 2024-01-04 DIAGNOSIS — L409 Psoriasis, unspecified: Secondary | ICD-10-CM | POA: Diagnosis not present

## 2024-01-04 DIAGNOSIS — R21 Rash and other nonspecific skin eruption: Secondary | ICD-10-CM

## 2024-01-04 DIAGNOSIS — Z79899 Other long term (current) drug therapy: Secondary | ICD-10-CM

## 2024-01-04 MED ORDER — CLOBETASOL PROPIONATE 0.05 % EX CREA: TOPICAL_CREAM | CUTANEOUS | 1 refills | Status: AC

## 2024-01-04 MED ORDER — CLOBETASOL PROPIONATE 0.05 % EX SOLN: CUTANEOUS | 1 refills | Status: AC

## 2024-01-04 MED ORDER — HYDROCORTISONE 2.5 % EX CREA: TOPICAL_CREAM | CUTANEOUS | 1 refills | Status: AC

## 2024-01-04 NOTE — Patient Instructions (Addendum)
 Wound Care Instructions  Cleanse wound gently with soap and water once a day then pat dry with clean gauze. Apply a thin coat of Petrolatum (petroleum jelly, Vaseline) over the wound (unless you have an allergy  to this). We recommend that you use a new, sterile tube of Vaseline. Do not pick or remove scabs. Do not remove the yellow or white healing tissue from the base of the wound.  Cover the wound with fresh, clean, nonstick gauze and secure with paper tape. You may use Band-Aids in place of gauze and tape if the wound is small enough, but would recommend trimming much of the tape off as there is often too much. Sometimes Band-Aids can irritate the skin.  You should call the office for your biopsy report after 1 week if you have not already been contacted.  If you experience any problems, such as abnormal amounts of bleeding, swelling, significant bruising, significant pain, or evidence of infection, please call the office immediately.  FOR ADULT SURGERY PATIENTS: If you need something for pain relief you may take 1 extra strength Tylenol  (acetaminophen ) AND 2 Ibuprofen  (200mg  each) together every 4 hours as needed for pain. (do not take these if you are allergic to them or if you have a reason you should not take them.) Typically, you may only need pain medication for 1 to 3 days.    Topical steroids (such as triamcinolone , fluocinolone, fluocinonide, mometasone, clobetasol , halobetasol, betamethasone, hydrocortisone ) can cause thinning and lightening of the skin if they are used for too long in the same area. Your physician has selected the right strength medicine for your problem and area affected on the body. Please use your medication only as directed by your physician to prevent side effects.        Due to recent changes in healthcare laws, you may see results of your pathology and/or laboratory studies on MyChart before the doctors have had a chance to review them. We understand that  in some cases there may be results that are confusing or concerning to you. Please understand that not all results are received at the same time and often the doctors may need to interpret multiple results in order to provide you with the best plan of care or course of treatment. Therefore, we ask that you please give us  2 business days to thoroughly review all your results before contacting the office for clarification. Should we see a critical lab result, you will be contacted sooner.   If You Need Anything After Your Visit  If you have any questions or concerns for your doctor, please call our main line at [phone] and press option 4 to reach your doctor's medical assistant. If no one answers, please leave a voicemail as directed and we will return your call as soon as possible. Messages left after 4 pm will be answered the following business day.   You may also send us  a message via MyChart. We typically respond to MyChart messages within 1-2 business days.  For prescription refills, please ask your pharmacy to contact our office. Our fax number is [phone].  If you have an urgent issue when the clinic is closed that cannot wait until the next business day, you can page your doctor at the number below.    Please note that while we do our best to be available for urgent issues outside of office hours, we are not available 24/7.   If you have an urgent issue and are unable to reach  us , you may choose to seek medical care at your doctor's office, retail clinic, urgent care center, or emergency room.  If you have a medical emergency, please immediately call 911 or go to the emergency department.  Pager Numbers  - Dr. Hester: [phone]  - Dr. Jackquline: [phone]  - Dr. Claudene: [phone]   In the event of inclement weather, please call our main line at [phone] for an update on the status of any delays or closures.  Dermatology Medication Tips: Please keep the boxes  that topical medications come in in order to help keep track of the instructions about where and how to use these. Pharmacies typically print the medication instructions only on the boxes and not directly on the medication tubes.   If your medication is too expensive, please contact our office at [phone] option 4 or send us  a message through MyChart.   We are unable to tell what your co-pay for medications will be in advance as this is different depending on your insurance coverage. However, we may be able to find a substitute medication at lower cost or fill out paperwork to get insurance to cover a needed medication.   If a prior authorization is required to get your medication covered by your insurance company, please allow us  1-2 business days to complete this process.  Drug prices often vary depending on where the prescription is filled and some pharmacies may offer cheaper prices.  The website www.goodrx.com contains coupons for medications through different pharmacies. The prices here do not account for what the cost may be with help from insurance (it may be cheaper with your insurance), but the website can give you the price if you did not use any insurance.  - You can print the associated coupon and take it with your prescription to the pharmacy.  - You may also stop by our office during regular business hours and pick up a GoodRx coupon card.  - If you need your prescription sent electronically to a different pharmacy, notify our office through [facility] or by phone at [phone] option 4.     Si Usted Necesita Algo Despus de Su Visita  Tambin puede enviarnos un mensaje a travs de Clinical cytogeneticist. Por lo general respondemos a los mensajes de MyChart en el transcurso de 1 a 2 das hbiles.  Para renovar recetas, por favor pida a su farmacia que se ponga en contacto con nuestra oficina. Randi lakes de fax es [place] [phone].  Si tiene un asunto urgente cuando la  clnica est cerrada y que no puede esperar hasta el siguiente da hbil, puede llamar/localizar a su doctor(a) al nmero que aparece a continuacin.   Por favor, tenga en cuenta que aunque hacemos todo lo posible para estar disponibles para asuntos urgentes fuera del horario de [place], no estamos disponibles las 24 horas del da, los 7 [address]  [address] de la [place].   Si tiene un problema urgente y no puede comunicarse con nosotros, puede optar por buscar atencin mdica  en el consultorio de su doctor(a), en una clnica privada, en un centro de atencin urgente o en una sala de emergencias.  Si tiene Engineer, drilling, por favor llame inmediatamente al 911 o vaya a la sala de emergencias.  Nmeros de bper  - Dr. Hester: [phone]  - Dra. Jackquline: [phone]  - Dr. Claudene: [phone]   En caso de inclemencias del tiempo, por favor llame a landry capes principal al [phone] para una actualizacin sobre el [place] de  cualquier retraso o cierre.  Consejos para la medicacin en dermatologa: Por favor, guarde las cajas en las que vienen los medicamentos de uso tpico para ayudarle a seguir las instrucciones sobre dnde y cmo usarlos. Las farmacias generalmente imprimen las instrucciones del medicamento slo en las cajas y no directamente en los tubos del [place].   Si su medicamento es muy caro, por favor, pngase en contacto con landry rieger llamando al [phone] y presione la opcin 4 o envenos un mensaje a travs de Clinical cytogeneticist.   No podemos decirle cul ser su copago por los medicamentos por adelantado ya que esto es diferente dependiendo de la cobertura de su seguro. Sin embargo, es posible que podamos encontrar un medicamento sustituto a Audiological scientist un formulario para que el seguro cubra el medicamento que se considera necesario.   Si se requiere una autorizacin previa para que su compaa de seguros malta su medicamento, por favor permtanos de 1 a 2 das hbiles  para completar este proceso.  Los precios de los medicamentos varan con frecuencia dependiendo del Environmental consultant de dnde se surte la receta y alguna farmacias pueden ofrecer precios ms baratos.  El sitio web www.goodrx.com tiene cupones para medicamentos de Health and safety inspector. Los precios aqu no tienen en cuenta lo que podra costar con la ayuda del seguro (puede ser ms barato con su seguro), pero el sitio web puede darle el precio si no utiliz Tourist information centre manager.  - Puede imprimir el cupn correspondiente y llevarlo con su receta a la farmacia.  - Tambin puede pasar por nuestra oficina durante el horario de atencin regular y Education officer, museum una tarjeta de cupones de GoodRx.  - Si necesita que su receta se enve electrnicamente a una farmacia diferente, informe a nuestra oficina a travs de MyChart de [place] o por telfono llamando al [phone] y presione la opcin 4.

## 2024-01-04 NOTE — Progress Notes (Signed)
 New Patient Visit   Subjective  Tonya Peterson is a 38 y.o. female who presents for the following: Patient c/o psoriasis on all over her body started ~6 months ago, rash first appeared on her left lower leg,  patient was prescribed prednisone  5 day taper dose and given topical cream Triamcinolone  cream by her PCP with a poor response.  No history of cancer  No history of exposure to tuberculosis   The following portions of the chart were reviewed this encounter and updated as appropriate: medications, allergies, medical history  Review of Systems:  No other skin or systemic complaints except as noted in HPI or Assessment and Plan.  Objective  Well appearing patient in no apparent distress; mood and affect are within normal limits.  A focused examination was performed of the following areas: Face,scalp,ears, arms,legs,chest,back   Relevant exam findings are noted in the Assessment and Plan.  right lower back Well-demarcated erythematous papules/plaques with silvery scale. Confluent on scalp, in external acoustic meatuses, scattered on arms, legs, trunk               Assessment & Plan   PSORIASIS right lower back Psoriasis, severe, involving special site (scalp), failed triamcinolone  and prednisone , 15-20% BSA, chronic and not at goal  Counseling on psoriasis and coordination of care  psoriasis is a chronic non-curable, but treatable genetic/hereditary disease that may have other systemic features affecting other organ systems such as joints (Psoriatic Arthritis). It is associated with an increased risk of inflammatory bowel disease, heart disease, non-alcoholic fatty liver disease, and depression.  Treatments include light and laser treatments; topical medications; and systemic medications including oral and injectables.  Labs ordered  Hepatitis B surface antibody,qualitative Hepatitis B core antibody, total Hepatitis B surface antigen HIV Antibody  (routine testing w rflx) QuantiFERON-TB Gold Plus RPR  Plan on starting Skyrizi 150 mg weeks 0, 4, then every 12 pending pathology and lab results   Start Clobetasol  cream apply to affected skin qd-bid prn, Avoid applying to face, groin, and axilla. Use as directed. Long-term use can cause thinning of the skin.  Start Hydrocortisone  2.5 % cream apply to groin/buttocks qd-bid prn Start Clobetasol  solution apply to scalp qd-bid prn, Avoid applying to face, groin, and axilla. Use as directed. Long-term use can cause thinning of the skin.  Avoid prednisone  for psoriasis, may cause flare after initial improvement  Topical steroids (such as triamcinolone , fluocinolone, fluocinonide, mometasone, clobetasol , halobetasol, betamethasone, hydrocortisone ) can cause thinning and lightening of the skin if they are used for too long in the same area. Your physician has selected the right strength medicine for your problem and area affected on the body. Please use your medication only as directed by your physician to prevent side effects.    Reviewed risks of biologics including immunosuppression, infections, injection site reaction, and failure to improve condition. Goal is control of skin condition, not cure.  Some older biologics such as Humira and Enbrel may slightly increase risk of malignancy and may worsen congestive heart failure.  Taltz and Cosentyx may cause inflammatory bowel disease to flare. The use of biologics requires long term medication management, including periodic office visits and monitoring of blood work.  Skin / nail biopsy - right lower back Type of biopsy: tangential   Informed consent: discussed and consent obtained   Patient was prepped and draped in usual sterile fashion: Area prepped with alcohol. Anesthesia: the lesion was anesthetized in a standard fashion   Anesthetic:  1% lidocaine  w/  epinephrine  1-100,000 buffered w/ 8.4% NaHCO3 Instrument used: flexible razor blade    Hemostasis achieved with: pressure, aluminum chloride and electrodesiccation   Outcome: patient tolerated procedure well   Post-procedure details: wound care instructions given   Post-procedure details comment:  Ointment and small bandage applied  Specimen 1 - Surgical pathology Differential Diagnosis: R/O Psoriasis   Check Margins: No LONG-TERM USE OF HIGH-RISK MEDICATION   Related Procedures Hepatitis B surface antibody,qualitative Hepatitis B core antibody, total Hepatitis B surface antigen HIV Antibody (routine testing w rflx) QuantiFERON-TB Gold Plus RPR COUNSELING AND COORDINATION OF CARE   MEDICATION MANAGEMENT   RASH      Return for pending results.  IFay Kirks, CMA, am acting as scribe for Boneta Sharps, MD .   Documentation: I have reviewed the above documentation for accuracy and completeness, and I agree with the above.  Boneta Sharps, MD

## 2024-01-05 DIAGNOSIS — Z79899 Other long term (current) drug therapy: Secondary | ICD-10-CM | POA: Diagnosis not present

## 2024-01-09 ENCOUNTER — Ambulatory Visit: Payer: Self-pay | Admitting: Dermatology

## 2024-01-09 ENCOUNTER — Other Ambulatory Visit: Payer: Self-pay | Admitting: Dermatology

## 2024-01-09 DIAGNOSIS — Z79899 Other long term (current) drug therapy: Secondary | ICD-10-CM

## 2024-01-09 DIAGNOSIS — L409 Psoriasis, unspecified: Secondary | ICD-10-CM

## 2024-01-09 LAB — RPR: RPR Ser Ql: NONREACTIVE

## 2024-01-09 LAB — HEPATITIS B SURFACE ANTIBODY,QUALITATIVE: Hep B Surface Ab, Qual: NONREACTIVE

## 2024-01-09 LAB — HEPATITIS B SURFACE ANTIGEN: Hepatitis B Surface Ag: NEGATIVE

## 2024-01-09 LAB — HEPATITIS B CORE ANTIBODY, TOTAL: Hep B Core Total Ab: NEGATIVE

## 2024-01-09 LAB — HIV ANTIBODY (ROUTINE TESTING W REFLEX): HIV Screen 4th Generation wRfx: NONREACTIVE

## 2024-01-09 LAB — QUANTIFERON-TB GOLD PLUS
QuantiFERON Mitogen Value: >10 [IU]/mL
QuantiFERON Nil Value: 0.07 [IU]/mL
QuantiFERON TB1 Ag Value: 0.06 [IU]/mL
QuantiFERON TB2 Ag Value: 0.07 [IU]/mL
QuantiFERON-TB Gold Plus: NEGATIVE

## 2024-01-09 MED ORDER — SKYRIZI PEN 150 MG/ML ~~LOC~~ SOAJ: 150.0000 mg | SUBCUTANEOUS | 2 refills | Status: AC

## 2024-01-09 MED ORDER — SKYRIZI PEN 150 MG/ML ~~LOC~~ SOAJ: 150.0000 mg | SUBCUTANEOUS | 0 refills | Status: AC

## 2024-01-11 ENCOUNTER — Ambulatory Visit (INDEPENDENT_AMBULATORY_CARE_PROVIDER_SITE_OTHER)

## 2024-01-11 DIAGNOSIS — L409 Psoriasis, unspecified: Secondary | ICD-10-CM | POA: Diagnosis not present

## 2024-01-11 MED ORDER — RISANKIZUMAB-RZAA 150 MG/ML ~~LOC~~ SOAJ
150.0000 mg | SUBCUTANEOUS | Status: AC
  Administered 2024-01-11 – 2024-02-08 (×2): 150 mg via SUBCUTANEOUS

## 2024-01-11 NOTE — Progress Notes (Signed)
 Patient here today to start Skyrizi  injections for Psoriasis Vulgaris.   Skyrizi  150mg  Pen injected into right upper arm. Patient tolerated injection well.   LOT: [id number] EXP: 12/28/2024  Alan Pizza, RMA

## 2024-01-12 LAB — SURGICAL PATHOLOGY

## 2024-01-26 ENCOUNTER — Other Ambulatory Visit: Payer: Self-pay | Admitting: *Deleted

## 2024-01-26 NOTE — Patient Instructions (Signed)
 [place] - I am sorry I was unable to reach you today for our scheduled appointment. I work with Gretel App, NP and am calling to support your healthcare needs. Please contact me at [phone] at your earliest convenience. I look forward to speaking with you soon.     Thank you,    Tedi Hughson, LCSW [place]  [facility], [facility] Licensed Clinical Social Worker  Direct Dial: [phone]

## 2024-02-06 ENCOUNTER — Encounter: Payer: Self-pay | Admitting: *Deleted

## 2024-02-06 ENCOUNTER — Telehealth: Payer: Self-pay | Admitting: *Deleted

## 2024-02-06 NOTE — Patient Instructions (Signed)
 [place] - I am sorry I was unable to reach you today  I work with Gretel App, NP and am calling to support your healthcare needs. Please contact me at [phone] at your earliest convenience. I look forward to speaking with you soon.   Thank you,     Marston Mccadden, LCSW [place]  [facility], [facility] Licensed Clinical Social Worker  Direct Dial: [phone]

## 2024-02-08 ENCOUNTER — Ambulatory Visit (INDEPENDENT_AMBULATORY_CARE_PROVIDER_SITE_OTHER)

## 2024-02-08 DIAGNOSIS — L409 Psoriasis, unspecified: Secondary | ICD-10-CM | POA: Diagnosis not present

## 2024-02-08 NOTE — Progress Notes (Signed)
 Patient here today for week 4 of Skyrizi  injections for Psoriasis Vulgaris.    Skyrizi  150mg  Pen injected into left upper thigh. Patient tolerated injection well.    LOT: [id number] EXP: 12/28/2024   Alan Pizza, RMA

## 2024-02-21 ENCOUNTER — Encounter: Payer: Self-pay | Admitting: *Deleted

## 2024-02-21 ENCOUNTER — Telehealth: Payer: Self-pay | Admitting: *Deleted

## 2024-02-21 NOTE — Patient Instructions (Signed)
 Visit Information   [employer] - I have attempted to call you three times but have been unsuccessful in reaching you. I work with Gretel App, NP and am calling to support your healthcare needs. If I can be of assistance to you, please contact me at [phone].     Thank you,    Leaner Morici, LCSW   [facility], [facility] Licensed Clinical Social Worker  Direct Dial: [phone]

## 2024-03-04 DIAGNOSIS — F902 Attention-deficit hyperactivity disorder, combined type: Secondary | ICD-10-CM | POA: Diagnosis not present

## 2024-03-04 DIAGNOSIS — F4323 Adjustment disorder with mixed anxiety and depressed mood: Secondary | ICD-10-CM | POA: Diagnosis not present

## 2024-03-11 ENCOUNTER — Encounter: Payer: Self-pay | Admitting: Dermatology

## 2024-03-11 ENCOUNTER — Ambulatory Visit: Admitting: Dermatology

## 2024-03-11 DIAGNOSIS — W908XXA Exposure to other nonionizing radiation, initial encounter: Secondary | ICD-10-CM

## 2024-03-11 DIAGNOSIS — L578 Other skin changes due to chronic exposure to nonionizing radiation: Secondary | ICD-10-CM | POA: Diagnosis not present

## 2024-03-11 DIAGNOSIS — L814 Other melanin hyperpigmentation: Secondary | ICD-10-CM

## 2024-03-11 DIAGNOSIS — L409 Psoriasis, unspecified: Secondary | ICD-10-CM

## 2024-03-11 DIAGNOSIS — B3731 Acute candidiasis of vulva and vagina: Secondary | ICD-10-CM

## 2024-03-11 DIAGNOSIS — Z1283 Encounter for screening for malignant neoplasm of skin: Secondary | ICD-10-CM | POA: Diagnosis not present

## 2024-03-11 DIAGNOSIS — B379 Candidiasis, unspecified: Secondary | ICD-10-CM

## 2024-03-11 DIAGNOSIS — D229 Melanocytic nevi, unspecified: Secondary | ICD-10-CM

## 2024-03-11 DIAGNOSIS — Z86018 Personal history of other benign neoplasm: Secondary | ICD-10-CM

## 2024-03-11 DIAGNOSIS — D1801 Hemangioma of skin and subcutaneous tissue: Secondary | ICD-10-CM

## 2024-03-11 DIAGNOSIS — L821 Other seborrheic keratosis: Secondary | ICD-10-CM

## 2024-03-11 DIAGNOSIS — Z79899 Other long term (current) drug therapy: Secondary | ICD-10-CM

## 2024-03-11 MED ORDER — FLUCONAZOLE 150 MG PO TABS: 150.0000 mg | ORAL_TABLET | ORAL | 0 refills | Status: AC

## 2024-03-11 NOTE — Patient Instructions (Signed)

## 2024-03-11 NOTE — Progress Notes (Signed)
 Follow-Up Visit   Subjective  Tonya Peterson is a 38 y.o. female who presents for the following: Skin Cancer Screening and Full Body Skin Exam  The patient presents for Total-Body Skin Exam (TBSE) for skin cancer screening and mole check. The patient has spots, moles and lesions to be evaluated, some may be new or changing and the patient may have concern these could be cancer.  Fhx skin cancer. Patient with hx of dysplastic nevi. She does have a spot at groin that itches and is kind of raw that she doesn't know if it could be the psoriasis. Patient has had loading dose of Skyrizi  and has had improvement, less itching. Uses ketoconazole  2% shampoo and HC 2.5% cr.  The following portions of the chart were reviewed this encounter and updated as appropriate: medications, allergies, medical history  Review of Systems:  No other skin or systemic complaints except as noted in HPI or Assessment and Plan.  Objective  Well appearing patient in no apparent distress; mood and affect are within normal limits.  A full examination was performed including scalp, head, eyes, ears, nose, lips, neck, chest, axillae, abdomen, back, buttocks, bilateral upper extremities, bilateral lower extremities, hands, feet, fingers, toes, fingernails, and toenails. All findings within normal limits unless otherwise noted below.   Relevant physical exam findings are noted in the Assessment and Plan.    Assessment & Plan   SKIN CANCER SCREENING PERFORMED TODAY.  ACTINIC DAMAGE - Chronic condition, secondary to cumulative UV/sun exposure - diffuse scaly erythematous macules with underlying dyspigmentation - Recommend daily broad spectrum sunscreen SPF 30+ to sun-exposed areas, reapply every 2 hours as needed.  - Staying in the shade or wearing long sleeves, sun glasses (UVA+UVB protection) and wide brim hats (4-inch brim around the entire circumference of the hat) are also recommended for sun protection.  -  Call for new or changing lesions.  LENTIGINES, SEBORRHEIC KERATOSES, HEMANGIOMAS - Benign normal skin lesions - Benign-appearing - Call for any changes  MELANOCYTIC NEVI - Tan-brown and/or pink-flesh-colored symmetric macules and papules - Benign appearing on exam today - Observation - Call clinic for new or changing moles - Recommend daily use of broad spectrum spf 30+ sunscreen to sun-exposed areas.   History of Dysplastic Nevi - No evidence of recurrence today - Recommend regular full body skin exams - Recommend daily broad spectrum sunscreen SPF 30+ to sun-exposed areas, reapply every 2 hours as needed.  - Call if any new or changing lesions are noted between office visits  PSORIASIS on Systemic Treatment, improving on Skyrizi  Exam: residual erythematous papules/plaques on trunk and extremities. 5-10% BSA.  Chronic and persistent condition with duration or expected duration over one year. Condition is improving with treatment but not currently at goal.  Psoriasis - severe on systemic treatment.  Psoriasis is a chronic non-curable, but treatable genetic/hereditary disease that may have other systemic features affecting other organ systems such as joints (Psoriatic Arthritis).  It is linked with heart disease, inflammatory bowel disease, non-alcoholic fatty liver disease, and depression. Significant skin psoriasis and/or psoriatic arthritis may have significant symptoms and affects activities of daily activity and often benefits from systemic treatments.  These systemic treatments have some potential side effects including immunosuppression and require pre-treatment laboratory screening and periodic laboratory monitoring and periodic in person evaluation and monitoring by the attending dermatologist physician (long term medication management).    Treatment Plan: Continue Skyrizi  150 mg SQ Q12 wks.   Reviewed risks of biologics including immunosuppression,  infections (i.e. TB  reactivation), injection site reaction, and failure to improve condition. Goal is control of skin condition, not cure.  Some older biologics such as Humira and Enbrel may slightly increase risk of malignancy and may worsen congestive heart failure.  Taltz, Cosentyx, and Bimzelx may cause inflammatory bowel disease to flare or may increase incidence of yeast infections. Skyrizi , Tremfya, and Stelara may also slightly increase risk of infection. The use of biologics requires long term medication management, including periodic office visits, annual TB screening test and monitoring of blood work.  Monitoring for Tuberculosis Infection:  Patient is currently receiving biologic therapy for psoriasis, which carries a potential risk for reactivation of tuberculosis infection. As part of ongoing safety monitoring, a QuantiFERON-TB Gold (QFT-G) test is checked prior to initiation of biologic therapy and yearly per guidelines. - Most recent QFT-G result:     Latest Ref Rng & Units 01/05/2024    2:55 PM  Quantiferon TB Gold  Quantiferon TB Gold Plus Negative Negative     - Will recheck yearly  - No signs or symptoms of active TB noted. - Will continue to monitor per standard biologic safety protocol. - If QFT-G becomes positive or patient develops symptoms suggestive of TB, appropriate evaluation will be initiated  Long term medication management.  Patient is using long term (months to years) prescription medication  to control their dermatologic condition.  These medications require periodic monitoring to evaluate for efficacy and side effects and may require periodic laboratory monitoring.   Concern for vaginal candidiasis Exam: whitish discharge on labia with fissure on L clitoral hood  Plan Start Fluconazole 150 mg x 1 or with second dose after 7 days if not clear. Call if not improved after these   Return in about 1 year (around 03/11/2025) for TBSE, with Dr. Claudene, HxDN, Psoriasis, 3 months for  psoriasis (05/02/24).  LILLETTE Lonell Drones, RMA, am acting as scribe for Boneta Claudene, MD .   Documentation: I have reviewed the above documentation for accuracy and completeness, and I agree with the above.  Boneta Claudene, MD

## 2024-03-14 ENCOUNTER — Ambulatory Visit: Admitting: Dermatology

## 2024-04-23 ENCOUNTER — Ambulatory Visit: Payer: BC Managed Care – PPO | Admitting: Nurse Practitioner

## 2024-04-23 ENCOUNTER — Encounter: Payer: Self-pay | Admitting: Nurse Practitioner

## 2024-04-23 VITALS — BP 130/78 | HR 118 | Temp 98.2°F | Ht 66.0 in | Wt 129.8 lb

## 2024-04-23 DIAGNOSIS — Z0000 Encounter for general adult medical examination without abnormal findings: Secondary | ICD-10-CM

## 2024-04-23 DIAGNOSIS — D696 Thrombocytopenia, unspecified: Secondary | ICD-10-CM

## 2024-04-23 DIAGNOSIS — F411 Generalized anxiety disorder: Secondary | ICD-10-CM

## 2024-04-23 DIAGNOSIS — L409 Psoriasis, unspecified: Secondary | ICD-10-CM | POA: Diagnosis not present

## 2024-04-23 DIAGNOSIS — Z0001 Encounter for general adult medical examination with abnormal findings: Secondary | ICD-10-CM | POA: Diagnosis not present

## 2024-04-23 DIAGNOSIS — F10220 Alcohol dependence with intoxication, uncomplicated: Secondary | ICD-10-CM | POA: Diagnosis not present

## 2024-04-23 DIAGNOSIS — K0889 Other specified disorders of teeth and supporting structures: Secondary | ICD-10-CM

## 2024-04-23 DIAGNOSIS — Z23 Encounter for immunization: Secondary | ICD-10-CM

## 2024-04-23 MED ORDER — AZITHROMYCIN 250 MG PO TABS: ORAL_TABLET | ORAL | 0 refills | Status: AC

## 2024-04-23 NOTE — Progress Notes (Signed)
 Tonya Glance, NP-C Phone: [phone]  Tonya Peterson is a 38 y.o. female who presents today for annual exam.   Discussed the use of AI scribe software for clinical note transcription with the patient, who gave verbal consent to proceed.  History of Present Illness   Tonya Peterson is a 38 year old female with psoriasis who presents for annual exam.  She was diagnosed with psoriasis and initially faced challenges in securing an appointment with the referred dermatology clinic. Eventually, she sought care at [facility], where she received two Skyrizi  injections. This treatment has significantly improved her condition, clearing most of the psoriasis, although some spots remain visible. Previously, the psoriasis was extensive, covering her body from head to toe, and was described as 'miserable'. It has now cleared from her head and mostly from her back and stomach.  She experiences no trouble swallowing unless she is thirsty, which she attributes to dryness.  Her mood, anxiety, and depression levels vary depending on circumstances, but she describes them as manageable. She uses lorazepam or trazodone as needed for anxiety and sleep issues. Overall, she feels she gets enough sleep most of the time, although not always. 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 She has financial concerns related to [employer] and child support, as her ex-husband's alimony payments will end soon, and she is responsible for half of her son's private school tuition. She has received an opportunity scholarship to help with tuition costs.      Social History   Tobacco Use  Smoking Status Former   Current packs/day: 0.00   Average packs/day: 0.5 packs/day for 6.6  years (3.3 ttl pk-yrs)   Types: Cigarettes   Start date: 02/05/2008   Quit date: 09/06/2014   Years since quitting: 9.6   Passive exposure: Past  Smokeless Tobacco Never    Current Outpatient Medications on File Prior to Visit  Medication Sig Dispense Refill   amphetamine-dextroamphetamine (ADDERALL) 30 MG tablet Take 1 tablet by mouth 2 (two) times daily.     clobetasol  (TEMOVATE ) 0.05 % external solution Apply to scalp qd-bid prn, Avoid applying to face, groin, and axilla. Use as directed. Long-term use can cause thinning of the skin. 50 mL 1   clobetasol  cream (TEMOVATE ) 0.05 % Apply to affected skin qd-bid prn, Avoid applying to face, groin, and axilla. Use as directed. Long-term use can cause thinning of the skin. 60 g 1   hydrocortisone  2.5 % cream Apply to affected skin in the groin/buttock area qd-bid prn 30 g 1   ketoconazole  (NIZORAL ) 2 % shampoo APPLY 1 APPLICATION TOPICALLY 2 (TWO) TIMES A WEEK. 120 mL 2   LORazepam (ATIVAN) 0.5 MG tablet Take 0.5 mg by mouth 2 (two) times daily as needed.     mupirocin  ointment (BACTROBAN ) 2 % Apply 1 Application topically 2 (two) times daily. Inside nostril, twice a day as needed 22 g 0   risankizumab -rzaa (SKYRIZI  PEN) 150 MG/ML pen Inject 1 mL (150 mg total) into the skin as directed. At week 4. 1 mL 0   risankizumab -rzaa (SKYRIZI  PEN) 150 MG/ML pen Inject 1 mL (150 mg total) into the skin as directed. Every 12 weeks for maintenance. 1 mL 2   traZODone (DESYREL) 50 MG tablet  Take 50 mg by mouth at bedtime.     No current facility-administered medications on file prior to visit.     ROS see history of present illness  Objective  Physical Exam Vitals:   04/23/24 1357  BP: 130/78  Pulse: (!) 118  Temp: 98.2 F (36.8 C)  SpO2: 98%    BP Readings from Last 3 Encounters:  04/23/24 130/78  10/11/23 118/84  09/12/23 120/80   Wt Readings from Last 3 Encounters:  04/23/24 129 lb 12.8 oz (58.9 kg)  10/11/23 131 lb 12.8 oz (59.8 kg)   09/12/23 133 lb (60.3 kg)    Physical Exam Constitutional:      General: She is not in acute distress.    Appearance: Normal appearance.  HENT:     Head: Normocephalic.     Right Ear: Tympanic membrane normal.     Left Ear: Tympanic membrane normal.     Nose: Nose normal.     Mouth/Throat:     Mouth: Mucous membranes are moist.     Pharynx: Oropharynx is clear.  Eyes:     Conjunctiva/sclera: Conjunctivae normal.     Pupils: Pupils are equal, round, and reactive to light.  Neck:     Thyroid: No thyromegaly.  Cardiovascular:     Rate and Rhythm: Normal rate and regular rhythm.     Heart sounds: Normal heart sounds.  Pulmonary:     Effort: Pulmonary effort is normal.     Breath sounds: Normal breath sounds.  Abdominal:     General: Abdomen is flat. Bowel sounds are normal.     Palpations: Abdomen is soft. There is no mass.     Tenderness: There is no abdominal tenderness.  Musculoskeletal:        General: Normal range of motion.  Lymphadenopathy:     Cervical: No cervical adenopathy.  Skin:    General: Skin is warm and dry.     Findings: No rash.  Neurological:     General: No focal deficit present.     Mental Status: She is alert.  Psychiatric:        Mood and Affect: Mood normal.        Behavior: Behavior normal.      Assessment/Plan: Please see individual problem list.  Routine general medical examination at a health care facility Assessment & Plan: Physical exam complete. We will check lab work as outlined. Pap smear is up to date. Declines flu and COVID vaccines. Tetanus booster administered today. Continue routine dental and eye exams. Encourage healthy diet and regular exercise. Return to care in one year, sooner as needed.    Alcohol dependence with uncomplicated intoxication (HCC) Assessment & Plan: Continues to drink alcohol daily but has reduced amount since changing jobs. Advised to continue efforts to reduce intake. Counseling and resources provided  to patient.   Orders: -     Comprehensive metabolic panel with GFR  Psoriasis Assessment & Plan: Her condition has significantly improved with Skyrizi , though some residual spots remain. Continue current management and follow up with Dermatology.    GAD (generalized anxiety disorder) Assessment & Plan: Her chronic anxiety is currently managed with Lorazepam 0.5mg  as needed, with recent life stressors contributing to increased anxiety. Continue Lorazepam as needed and follow up with psychiatry.    Tooth pain -     Azithromycin ; Take 2 tablets on day 1, then 1 tablet daily on days 2 through 5  Dispense: 6 tablet; Refill: 0  Low platelet count -  CBC with Differential/Platelet  Need for Tdap vaccination -     Tdap vaccine greater than or equal to 7yo IM     Return in about 1 year (around 04/23/2025) for Annual Exam, sooner as needed.   Tonya Glance, NP-C [facility]

## 2024-04-24 LAB — COMPREHENSIVE METABOLIC PANEL WITH GFR
ALT: 80 U/L — ABNORMAL HIGH (ref 0–35)
AST: 69 U/L — ABNORMAL HIGH (ref 0–37)
Albumin: 4.6 g/dL (ref 3.5–5.2)
Alkaline Phosphatase: 55 U/L (ref 39–117)
BUN: 15 mg/dL (ref 6–23)
CO2: 27 meq/L (ref 19–32)
Calcium: 10 mg/dL (ref 8.4–10.5)
Chloride: 100 meq/L (ref 96–112)
Creatinine, Ser: 0.86 mg/dL (ref 0.40–1.20)
GFR: 86.91 mL/min (ref >60.00)
Glucose, Bld: 95 mg/dL (ref 70–99)
Potassium: 4 meq/L (ref 3.5–5.1)
Sodium: 139 meq/L (ref 135–145)
Total Bilirubin: 0.5 mg/dL (ref 0.2–1.2)
Total Protein: 7.3 g/dL (ref 6.0–8.3)

## 2024-04-24 LAB — CBC WITH DIFFERENTIAL/PLATELET
Basophils Absolute: 0.1 K/uL (ref 0.0–0.1)
Basophils Relative: 0.7 % (ref 0.0–3.0)
Eosinophils Absolute: 0.1 K/uL (ref 0.0–0.7)
Eosinophils Relative: 0.9 % (ref 0.0–5.0)
HCT: 42.4 % (ref 36.0–46.0)
Hemoglobin: 14.4 g/dL (ref 12.0–15.0)
Lymphocytes Relative: 16.2 % (ref 12.0–46.0)
Lymphs Abs: 1.2 K/uL (ref 0.7–4.0)
MCHC: 33.9 g/dL (ref 30.0–36.0)
MCV: 101.6 fl — ABNORMAL HIGH (ref 78.0–100.0)
Monocytes Absolute: 0.6 K/uL (ref 0.1–1.0)
Monocytes Relative: 8.5 % (ref 3.0–12.0)
Neutro Abs: 5.5 K/uL (ref 1.4–7.7)
Neutrophils Relative %: 73.7 % (ref 43.0–77.0)
Platelets: 191 K/uL (ref 150.0–400.0)
RBC: 4.17 Mil/uL (ref 3.87–5.11)
RDW: 13.3 % (ref 11.5–15.5)
WBC: 7.5 K/uL (ref 4.0–10.5)

## 2024-05-02 ENCOUNTER — Encounter: Payer: Self-pay | Admitting: Dermatology

## 2024-05-02 ENCOUNTER — Ambulatory Visit: Admitting: Dermatology

## 2024-05-02 DIAGNOSIS — Z79899 Other long term (current) drug therapy: Secondary | ICD-10-CM

## 2024-05-02 DIAGNOSIS — L409 Psoriasis, unspecified: Secondary | ICD-10-CM

## 2024-05-02 DIAGNOSIS — B3731 Acute candidiasis of vulva and vagina: Secondary | ICD-10-CM

## 2024-05-02 DIAGNOSIS — B379 Candidiasis, unspecified: Secondary | ICD-10-CM

## 2024-05-02 DIAGNOSIS — Z7189 Other specified counseling: Secondary | ICD-10-CM

## 2024-05-02 MED ORDER — RISANKIZUMAB-RZAA 150 MG/ML ~~LOC~~ SOAJ
150.0000 mg | SUBCUTANEOUS | Status: AC
  Administered 2024-05-02: 150 mg via SUBCUTANEOUS

## 2024-05-02 MED ORDER — FLUCONAZOLE 200 MG PO TABS: ORAL_TABLET | ORAL | 0 refills | Status: AC

## 2024-05-02 NOTE — Progress Notes (Signed)
 Follow-Up Visit   Subjective  Tonya Peterson is a 38 y.o. female who presents for the following: Psoriasis. 3 month follow up. Torso, extremities. Started Skyrizi  01/11/2024.  She states her psoriasis has improved since starting Skyrizi . Has almost completely cleared. Tolerating well.   Thinks has recurrent yeast infection. Took first pill of fluconazole  but lost 2nd pill. Will be having a root canal soon so she will start oral Clindamycin prior to that procedure.   The following portions of the chart were reviewed this encounter and updated as appropriate: medications, allergies, medical history  Review of Systems:  No other skin or systemic complaints except as noted in HPI or Assessment and Plan.  Objective  Well appearing patient in no apparent distress; mood and affect are within normal limits.  Areas Examined: Torso, arms, legs  Relevant exam findings are noted in the Assessment and Plan.      Assessment & Plan   PSORIASIS   Related Medications risankizumab -rzaa (SKYRIZI  PEN) 150 MG/ML pen Inject 1 mL (150 mg total) into the skin as directed. At week 4. risankizumab -rzaa (SKYRIZI  PEN) 150 MG/ML pen Inject 1 mL (150 mg total) into the skin as directed. Every 12 weeks for maintenance. risankizumab -rzaa (SKYRIZI ) pen 150 mg  LONG-TERM USE OF HIGH-RISK MEDICATION   Related Medications risankizumab -rzaa (SKYRIZI  PEN) 150 MG/ML pen Inject 1 mL (150 mg total) into the skin as directed. At week 4. risankizumab -rzaa (SKYRIZI  PEN) 150 MG/ML pen Inject 1 mL (150 mg total) into the skin as directed. Every 12 weeks for maintenance. COUNSELING AND COORDINATION OF CARE   MEDICATION MANAGEMENT   CANDIDIASIS    PSORIASIS on systemic treatment, improving on skyrizi  Well-demarcated erythematous papules/plaques with silvery scale, guttate pink scaly papules L lower leg. <1% BSA.  Chronic and persistent condition with duration or expected duration over one year.  Condition is improving with treatment but not currently at goal.   Counseling and coordination of care for severe psoriasis on systemic treatment  Psoriasis - severe on systemic treatment.  Psoriasis is a chronic non-curable, but treatable genetic/hereditary disease that may have other systemic features affecting other organ systems such as joints (Psoriatic Arthritis).  It is linked with heart disease, inflammatory bowel disease, non-alcoholic fatty liver disease, and depression. Significant skin psoriasis and/or psoriatic arthritis may have significant symptoms and affects activities of daily activity and often benefits from systemic treatments.  These systemic treatments have some potential side effects including immunosuppression and require pre-treatment laboratory screening and periodic laboratory monitoring and periodic in person evaluation and monitoring by the attending dermatologist physician (long term medication management).   Patient denies joint pain  Treatment Plan: Continue Skyrizi  150 mg every 12 weeks.  Skyrizi  150 mg/ml injected into right upper thigh today. Patient tolerated well.  Exp: 09/2024 Lot: [id number]  Reviewed risks of biologics including immunosuppression, infections (i.e. TB reactivation), injection site reaction, and failure to improve condition. Goal is control of skin condition, not cure.  Some older biologics such as Humira and Enbrel may slightly increase risk of malignancy and may worsen congestive heart failure.  Taltz, Cosentyx, and Bimzelx may cause inflammatory bowel disease to flare or may increase incidence of yeast infections. Skyrizi , Tremfya, and Stelara may also slightly increase risk of infection. The use of biologics requires long term medication management, including periodic office visits, annual TB screening test and monitoring of blood work.   Long term medication management.  Patient is using long term (months to years) prescription medication  to  control their dermatologic condition.  These medications require periodic monitoring to evaluate for efficacy and side effects and may require periodic laboratory monitoring.   Concern for vaginal candidiasis Exam: area not examined today.   Plan Start Fluconazole  150 mg x 1 or with second dose after 7 days if not clear. Call if not improved after these Will flare with antibiotics. Try OTC treatments  Return in about 3 months (around 07/31/2024) for Injection On Nurse Schedule (Skyrizi ); 8 months for psoriasis follow up.  I, Jill Parcell, CMA, am acting as scribe for Boneta Sharps, MD.   Documentation: I have reviewed the above documentation for accuracy and completeness, and I agree with the above.  Boneta Sharps, MD

## 2024-05-02 NOTE — Patient Instructions (Signed)
 Continue Skyrizi  150 mg every 12 weeks.    Reviewed risks of biologics including immunosuppression, infections (i.e. TB reactivation), injection site reaction, and failure to improve condition. Goal is control of skin condition, not cure.  Some older biologics such as Humira and Enbrel may slightly increase risk of malignancy and may worsen congestive heart failure.  Taltz, Cosentyx, and Bimzelx may cause inflammatory bowel disease to flare or may increase incidence of yeast infections. Skyrizi , Tremfya, and Stelara may also slightly increase risk of infection. The use of biologics requires long term medication management, including periodic office visits, annual TB screening test and monitoring of blood work.    Due to recent changes in healthcare laws, you may see results of your pathology and/or laboratory studies on MyChart before the doctors have had a chance to review them. We understand that in some cases there may be results that are confusing or concerning to you. Please understand that not all results are received at the same time and often the doctors may need to interpret multiple results in order to provide you with the best plan of care or course of treatment. Therefore, we ask that you please give us  2 business days to thoroughly review all your results before contacting the office for clarification. Should we see a critical lab result, you will be contacted sooner.   If You Need Anything After Your Visit  If you have any questions or concerns for your doctor, please call our main line at [phone] and press option 4 to reach your doctor's medical assistant. If no one answers, please leave a voicemail as directed and we will return your call as soon as possible. Messages left after 4 pm will be answered the following business day.   You may also send us  a message via MyChart. We typically respond to MyChart messages within 1-2 business days.  For prescription refills, please ask  your pharmacy to contact our office. Our fax number is [phone].  If you have an urgent issue when the clinic is closed that cannot wait until the next business day, you can page your doctor at the number below.    Please note that while we do our best to be available for urgent issues outside of office hours, we are not available 24/7.   If you have an urgent issue and are unable to reach us , you may choose to seek medical care at your doctor's office, retail clinic, urgent care center, or emergency room.  If you have a medical emergency, please immediately call 911 or go to the emergency department.  Pager Numbers  - Dr. Hester: [phone]  - Dr. Jackquline: [phone]  - Dr. Claudene: [phone]   - Dr. Raymund: [phone]  In the event of inclement weather, please call our main line at [phone] for an update on the status of any delays or closures.  Dermatology Medication Tips: Please keep the boxes that topical medications come in in order to help keep track of the instructions about where and how to use these. Pharmacies typically print the medication instructions only on the boxes and not directly on the medication tubes.   If your medication is too expensive, please contact our office at [phone] option 4 or send us  a message through MyChart.   We are unable to tell what your co-pay for medications will be in advance as this is different depending on your insurance coverage. However, we may be able to find a substitute medication at lower cost or  fill out paperwork to get insurance to cover a needed medication.   If a prior authorization is required to get your medication covered by your insurance company, please allow us  1-2 business days to complete this process.  Drug prices often vary depending on where the prescription is filled and some pharmacies may offer cheaper prices.  The website www.goodrx.com contains coupons for medications through different  pharmacies. The prices here do not account for what the cost may be with help from insurance (it may be cheaper with your insurance), but the website can give you the price if you did not use any insurance.  - You can print the associated coupon and take it with your prescription to the pharmacy.  - You may also stop by our office during regular business hours and pick up a GoodRx coupon card.  - If you need your prescription sent electronically to a different pharmacy, notify our office through [facility] or by phone at [phone] option 4.     Si Usted Necesita Algo Despus de Su Visita  Tambin puede enviarnos un mensaje a travs de Clinical Cytogeneticist. Por lo general respondemos a los mensajes de MyChart en el transcurso de 1 a 2 das hbiles.  Para renovar recetas, por favor pida a su farmacia que se ponga en contacto con nuestra oficina. Randi lakes de fax es [place] [phone].  Si tiene un asunto urgente cuando la clnica est cerrada y que no puede esperar hasta el siguiente da hbil, puede llamar/localizar a su doctor(a) al nmero que aparece a continuacin.   Por favor, tenga en cuenta que aunque hacemos todo lo posible para estar disponibles para asuntos urgentes fuera del horario de [place], no estamos disponibles las 24 horas del da, los 7 [address]  [address] de la [place].   Si tiene un problema urgente y no puede comunicarse con nosotros, puede optar por buscar atencin mdica  en el consultorio de su doctor(a), en una clnica privada, en un centro de atencin urgente o en una sala de emergencias.  Si tiene engineer, drilling, por favor llame inmediatamente al 911 o vaya a la sala de emergencias.  Nmeros de bper  - Dr. Hester: [phone]  - Dra. Jackquline: [phone]  - Dr. Claudene: [phone]  - Dra. Kitts: [phone]  En caso de inclemencias del [place], por favor llame a nuestra lnea principal al [phone] para una actualizacin sobre el estado de cualquier retraso o  cierre.  Consejos para la medicacin en dermatologa: Por favor, guarde las cajas en las que vienen los medicamentos de uso tpico para ayudarle a seguir las instrucciones sobre dnde y cmo usarlos. Las farmacias generalmente imprimen las instrucciones del medicamento slo en las cajas y no directamente en los tubos del [place].   Si su medicamento es muy caro, por favor, pngase en contacto con landry rieger llamando al [phone] y presione la opcin 4 o envenos un mensaje a travs de Clinical Cytogeneticist.   No podemos decirle cul ser su copago por los medicamentos por adelantado ya que esto es diferente dependiendo de la cobertura de su seguro. Sin embargo, es posible que podamos encontrar un medicamento sustituto a audiological scientist un formulario para que el seguro cubra el medicamento que se considera necesario.   Si se requiere una autorizacin previa para que su compaa de seguros cubra su medicamento, por favor permtanos de 1 a 2 das hbiles para completar este proceso.  Los precios de los medicamentos varan con frecuencia dependiendo del environmental consultant de  dnde se surte la receta y alguna farmacias pueden ofrecer precios ms baratos.  El sitio web www.goodrx.com tiene cupones para medicamentos de health and safety inspector. Los precios aqu no tienen en cuenta lo que podra costar con la ayuda del seguro (puede ser ms barato con su seguro), pero el sitio web puede darle el precio si no utiliz tourist information centre manager.  - Puede imprimir el cupn correspondiente y llevarlo con su receta a la farmacia.  - Tambin puede pasar por nuestra oficina durante el horario de atencin regular y education officer, museum una tarjeta de cupones de GoodRx.  - Si necesita que su receta se enve electrnicamente a una farmacia diferente, informe a nuestra oficina a travs de MyChart de [place] o por telfono llamando al [phone] y presione la opcin 4.

## 2024-05-07 ENCOUNTER — Ambulatory Visit: Payer: Self-pay | Admitting: Nurse Practitioner

## 2024-05-13 ENCOUNTER — Encounter: Payer: Self-pay | Admitting: Nurse Practitioner

## 2024-05-13 DIAGNOSIS — Z0000 Encounter for general adult medical examination without abnormal findings: Secondary | ICD-10-CM | POA: Insufficient documentation

## 2024-05-13 DIAGNOSIS — K0889 Other specified disorders of teeth and supporting structures: Secondary | ICD-10-CM | POA: Insufficient documentation

## 2024-05-13 NOTE — Assessment & Plan Note (Signed)
 Her condition has significantly improved with Skyrizi , though some residual spots remain. Continue current management and follow up with Dermatology.

## 2024-05-13 NOTE — Assessment & Plan Note (Signed)
 Her chronic anxiety is currently managed with Lorazepam 0.5mg  as needed, with recent life stressors contributing to increased anxiety. Continue Lorazepam as needed and follow up with psychiatry.

## 2024-05-13 NOTE — Assessment & Plan Note (Signed)
 Physical exam complete. We will check lab work as outlined. Pap smear is up to date. Declines flu and COVID vaccines. Tetanus booster administered today. Continue routine dental and eye exams. Encourage healthy diet and regular exercise. Return to care in one year, sooner as needed.

## 2024-05-13 NOTE — Assessment & Plan Note (Addendum)
 Continues to drink alcohol daily but has reduced amount since changing jobs. Advised to continue efforts to reduce intake. Counseling and resources provided to patient.

## 2024-05-21 DIAGNOSIS — F4323 Adjustment disorder with mixed anxiety and depressed mood: Secondary | ICD-10-CM | POA: Diagnosis not present

## 2024-05-21 DIAGNOSIS — F902 Attention-deficit hyperactivity disorder, combined type: Secondary | ICD-10-CM | POA: Diagnosis not present

## 2024-06-26 ENCOUNTER — Encounter

## 2024-07-31 ENCOUNTER — Ambulatory Visit

## 2024-12-31 ENCOUNTER — Ambulatory Visit: Admitting: Dermatology

## 2025-03-11 ENCOUNTER — Ambulatory Visit: Admitting: Dermatology

## 2025-04-29 ENCOUNTER — Encounter: Admitting: Nurse Practitioner
